# Patient Record
Sex: Female | Born: 1965 | Race: Black or African American | Hispanic: No | Marital: Single | Smoking: Never smoker
Health system: Southern US, Community
[De-identification: ages and names within clinical notes are randomized; demographics above are authoritative.]

## PROBLEM LIST (undated history)

## (undated) DIAGNOSIS — IMO0001 Reserved for inherently not codable concepts without codable children: Secondary | ICD-10-CM

## (undated) DIAGNOSIS — R030 Elevated blood-pressure reading, without diagnosis of hypertension: Secondary | ICD-10-CM

## (undated) DIAGNOSIS — R591 Generalized enlarged lymph nodes: Secondary | ICD-10-CM

## (undated) DIAGNOSIS — R87629 Unspecified abnormal cytological findings in specimens from vagina: Secondary | ICD-10-CM

## (undated) DIAGNOSIS — R61 Generalized hyperhidrosis: Secondary | ICD-10-CM

## (undated) DIAGNOSIS — N63 Unspecified lump in unspecified breast: Secondary | ICD-10-CM

## (undated) DIAGNOSIS — I10 Essential (primary) hypertension: Secondary | ICD-10-CM

## (undated) DIAGNOSIS — N805 Endometriosis of intestine, unspecified: Secondary | ICD-10-CM

## (undated) DIAGNOSIS — N951 Menopausal and female climacteric states: Secondary | ICD-10-CM

## (undated) DIAGNOSIS — Z973 Presence of spectacles and contact lenses: Secondary | ICD-10-CM

## (undated) DIAGNOSIS — D219 Benign neoplasm of connective and other soft tissue, unspecified: Secondary | ICD-10-CM

## (undated) HISTORY — DX: Reserved for inherently not codable concepts without codable children: IMO0001

## (undated) HISTORY — DX: Endometriosis of intestine: N80.5

## (undated) HISTORY — DX: Elevated blood-pressure reading, without diagnosis of hypertension: R03.0

## (undated) HISTORY — PX: OOPHORECTOMY: SHX86

## (undated) HISTORY — DX: Endometriosis of intestine, unspecified: N80.50

## (undated) HISTORY — PX: BREAST EXCISIONAL BIOPSY: SUR124

## (undated) HISTORY — PX: BREAST SURGERY: SHX581

## (undated) HISTORY — DX: Benign neoplasm of connective and other soft tissue, unspecified: D21.9

## (undated) HISTORY — DX: Menopausal and female climacteric states: N95.1

## (undated) HISTORY — DX: Unspecified abnormal cytological findings in specimens from vagina: R87.629

## (undated) HISTORY — DX: Generalized hyperhidrosis: R61

## (undated) HISTORY — DX: Unspecified lump in unspecified breast: N63.0

---

## 2006-09-28 HISTORY — PX: ABDOMINAL HYSTERECTOMY: SHX81

## 2007-06-20 ENCOUNTER — Ambulatory Visit: Payer: Self-pay | Admitting: Unknown Physician Specialty

## 2007-06-23 ENCOUNTER — Ambulatory Visit: Payer: Self-pay | Admitting: Unknown Physician Specialty

## 2014-08-14 LAB — HM MAMMOGRAPHY

## 2014-09-25 LAB — HM PAP SMEAR: HM Pap smear: NEGATIVE

## 2015-07-10 ENCOUNTER — Telehealth: Payer: Self-pay | Admitting: Obstetrics and Gynecology

## 2015-07-10 NOTE — Telephone Encounter (Signed)
PT HAS H/O BV AND THINKS SHE HAS IT NOW, WANTED TO KNOW IF DR DE COULD CALL IN RX FOR AT [place].

## 2015-07-11 MED ORDER — METRONIDAZOLE 0.75 % VA GEL: 1.0000 | Freq: Every day | VAGINAL | Status: DC

## 2015-07-11 NOTE — Telephone Encounter (Signed)
Pt aware.

## 2015-07-11 NOTE — Telephone Encounter (Signed)
Med erx.  

## 2015-07-11 NOTE — Addendum Note (Signed)
Addended by: Elouise Munroe on: 07/11/2015 11:47 AM   Modules accepted: Orders

## 2015-07-31 ENCOUNTER — Encounter: Payer: Self-pay | Admitting: Obstetrics and Gynecology

## 2015-07-31 ENCOUNTER — Ambulatory Visit (INDEPENDENT_AMBULATORY_CARE_PROVIDER_SITE_OTHER): Payer: 59 | Admitting: Obstetrics and Gynecology

## 2015-07-31 VITALS — BP 137/91 | HR 80 | Ht 67.0 in | Wt 163.2 lb

## 2015-07-31 DIAGNOSIS — Z90711 Acquired absence of uterus with remaining cervical stump: Secondary | ICD-10-CM

## 2015-07-31 DIAGNOSIS — IMO0001 Reserved for inherently not codable concepts without codable children: Secondary | ICD-10-CM

## 2015-07-31 DIAGNOSIS — Z01419 Encounter for gynecological examination (general) (routine) without abnormal findings: Secondary | ICD-10-CM

## 2015-07-31 DIAGNOSIS — Z90710 Acquired absence of both cervix and uterus: Secondary | ICD-10-CM | POA: Diagnosis not present

## 2015-07-31 DIAGNOSIS — Z1239 Encounter for other screening for malignant neoplasm of breast: Secondary | ICD-10-CM

## 2015-07-31 DIAGNOSIS — Z1211 Encounter for screening for malignant neoplasm of colon: Secondary | ICD-10-CM | POA: Diagnosis not present

## 2015-07-31 DIAGNOSIS — Z0000 Encounter for general adult medical examination without abnormal findings: Secondary | ICD-10-CM | POA: Diagnosis not present

## 2015-07-31 DIAGNOSIS — R030 Elevated blood-pressure reading, without diagnosis of hypertension: Secondary | ICD-10-CM | POA: Diagnosis not present

## 2015-07-31 NOTE — Progress Notes (Signed)
Patient ID: Tara [name], female   DOB: 12/29/1965, 49 y.o.   MRN: [id number] ANNUAL PREVENTATIVE CARE GYN  ENCOUNTER NOTE  Subjective:       Tara [name] is a 49 y.o. G77P1001 female here for a routine annual gynecologic exam.  Current complaints: 1.  Routine wellness exam   Gynecologic History No LMP recorded. Patient has had a hysterectomy. Contraception: status post hysterectomy, LSH-RSO Last Pap: 08/2014 neg/neg. Results were: normal. History of abnormal paps with positive HPV. Last mammogram: 07/2014. Results were: normal  Obstetric History OB History  Gravida Para Term Preterm AB SAB TAB Ectopic Multiple Living  1 1 1       1     # Outcome Date GA Lbr Len/2nd Weight Sex Delivery Anes PTL Lv  1 Term    6 lb 14.4 oz (3.13 kg) F Vag-Spont   Y      Past Medical History  Diagnosis Date  . Breast lump in female     rt side 10 oclock  . Night sweats   . Elevated BP   . Vaginal Pap smear, abnormal     ascus.pos  . Perimenopausal   . Fibroid     h/o    Past Surgical History  Procedure Laterality Date  . Breast surgery Right     lumpectomy   . Abdominal hysterectomy  2008    lsh/rso    No current outpatient prescriptions on file prior to visit.   No current facility-administered medications on file prior to visit.    No Known Allergies  Social History   Social History  . Marital Status: Single    Spouse Name: N/A  . Number of Children: N/A  . Years of Education: N/A   Occupational History  . Not on file.   Social History Main Topics  . Smoking status: Never Smoker   . Smokeless tobacco: Not on file  . Alcohol Use: Yes     Comment: occas  . Drug Use: No  . Sexual Activity: Yes    Birth Control/ Protection: Surgical   Other Topics Concern  . Not on file   Social History Narrative    Family History  Problem Relation Age of Onset  . Breast cancer Sister   . Diabetes Neg Hx   . Heart disease Neg Hx   . Colon cancer Neg Hx   . Ovarian  cancer Neg Hx     The following portions of the patient's history were reviewed and updated as appropriate: allergies, current medications, past family history, past medical history, past social history, past surgical history and problem list.  Review of Systems ROS Review of Systems - General ROS: negative for - chills, fatigue, fever, hot flashes, night sweats Psychological ROS: negative for - anxiety, decreased libido, depression, mood swings Ophthalmic ROS: negative for - blurry vision ENT ROS: negative for - headaches, hearing change, visual changes  Hematological and Lymphatic ROS: negative for - bleeding problems, bruising, swollen lymph nodes or weight loss Endocrine ROS: negative for - galactorrhea, hair pattern changes, hot flashes, malaise/lethargy, mood swings Breast ROS: negative for - new or changing breast lumps or nipple discharge Respiratory ROS: negative for - cough or shortness of breath Cardiovascular ROS: negative for - chest pain, irregular heartbeat, palpitations or shortness of breath Gastrointestinal ROS: no abdominal pain, change in bowel habits, or black or bloody stools Genito-Urinary ROS: no dysuria, trouble voiding, or hematuria Dermatological ROS: negative for rash and skin lesion changes  Objective:   BP 137/91 mmHg  Pulse 80  Ht 5\' 7"  (1.702 m)  Wt 163 lb 3.2 oz (74.027 kg)  BMI 25.55 kg/m2 CONSTITUTIONAL: Well-developed, well-nourished female in no acute distress.  PSYCHIATRIC: Normal mood and affect. Normal behavior.  [place]: Alert and oriented to person, place, and time. Normal muscle tone coordination.  HENT:  Normocephalic, atraumatic EYES: Conjunctivae and EOM are normal. .  NECK: Normal range of motion, supple, no masses.  Normal thyroid.  SKIN: Skin is warm and dry. No rash noted. Not diaphoretic. No erythema. No pallor. CARDIOVASCULAR: Normal heart rate noted, regular rhythm, no murmur. RESPIRATORY: Clear to auscultation bilaterally.   BREASTS: Symmetric in size. No masses, skin changes, nipple drainage, or lymphadenopathy. Scar from previous lumpectomy at 9 'oclock R breast, well healed ABDOMEN: Soft, normal bowel sounds, no distention noted.  No tenderness, rebound or guarding.  BLADDER: Normal PELVIC:  External Genitalia: Normal  BUS: Normal  Vagina: Normal  Cervix: Minimal white discharge  Uterus: Surgically absent  Adnexa: No  Masses palpable  RV: External Exam NormaI, No Rectal Masses and Normal Sphincter tone  MUSCULOSKELETAL: Normal range of motion.  LYMPHATIC: No Axillary, Supraclavicular, or Inguinal Adenopathy.    Assessment:   Annual gynecologic examination 49 y.o.  Contraception: status post hysterectomy, LSH-RSO secondary to fibroids Normal BMI  Elevated Blood pressure Family history of breast cancer   Plan:  Pap: Not needed until 2018 Mammogram: Ordered Stool Guaiac Testing:  Not Indicated Labs: thru employer Routine preventative health maintenance measures emphasized: Exercise/Diet/Weight control, Tobacco Warnings, Alcohol/Substance use risks and Stress Management Continue vitamin D/calcium supplements, weight bearing exercises Will need to monitor blood pressure   Return to [place], CMA [place], PA-S Brayton Mars, MD   I have seen, interviewed, and examined the patient in conjunction with the [place].A. student and affirm the diagnosis and management plan. Martin A. DeFrancesco, MD, Cherlynn June

## 2015-07-31 NOTE — Patient Instructions (Signed)
1.  Pap smear not done today.  Next Pap is due in 2018. 2.  Mammogram recommended. 3.  Stool guaiac cards. 4.  Screening labs 5.  Continue with calcium and vitamin D supplementation, weight bearing exercise. 6.  Return in 1 year.

## 2015-08-12 ENCOUNTER — Telehealth: Payer: Self-pay

## 2015-08-12 ENCOUNTER — Other Ambulatory Visit: Payer: Self-pay

## 2015-08-12 NOTE — Telephone Encounter (Signed)
Gastroenterology Pre-Procedure Review  Request Date: 08/30/15 Requesting Physician:   PATIENT REVIEW QUESTIONS: The patient responded to the following health history questions as indicated:    1. Are you having any GI issues? no 2. Do you have a personal history of Polyps? no 3. Do you have a family history of Colon Cancer or Polyps? no 4. Diabetes Mellitus? no 5. Joint replacements in the past 12 months?no 6. Major health problems in the past 3 months?no 7. Any artificial heart valves, MVP, or defibrillator?no    MEDICATIONS & ALLERGIES:    Patient reports the following regarding taking any anticoagulation/antiplatelet therapy:   Plavix, Coumadin, Eliquis, Xarelto, Lovenox, Pradaxa, Brilinta, or Effient? no Aspirin? no  Patient confirms/reports the following medications:  Current Outpatient Prescriptions  Medication Sig Dispense Refill  . cholecalciferol (VITAMIN D) 1000 UNITS tablet Take 1,000 Units by mouth daily.    . Multiple Vitamin (MULTIVITAMIN) tablet Take 1 tablet by mouth daily.     No current facility-administered medications for this visit.    Patient confirms/reports the following allergies:  No Known Allergies  No orders of the defined types were placed in this encounter.    AUTHORIZATION INFORMATION Primary Insurance: 1D#: Group #:  Secondary Insurance: 1D#: Group #:  SCHEDULE INFORMATION: Date: 08/30/15 Time: Location: [place]

## 2015-08-20 ENCOUNTER — Encounter: Payer: Self-pay | Admitting: *Deleted

## 2015-08-29 NOTE — Discharge Instructions (Signed)

## 2015-08-30 ENCOUNTER — Ambulatory Visit: Payer: 59 | Admitting: Anesthesiology

## 2015-08-30 ENCOUNTER — Encounter
Admission: RE | Disposition: A | Discharge status: Home / Self Care | Payer: Self-pay | Source: Ambulatory Visit | Attending: Gastroenterology

## 2015-08-30 ENCOUNTER — Ambulatory Visit
Admission: RE | Admit: 2015-08-30 | Discharge: 2015-08-30 | Disposition: A | Discharge status: Home / Self Care | Payer: 59 | Source: Ambulatory Visit | Attending: Gastroenterology | Admitting: Gastroenterology

## 2015-08-30 DIAGNOSIS — Z1211 Encounter for screening for malignant neoplasm of colon: Secondary | ICD-10-CM | POA: Insufficient documentation

## 2015-08-30 DIAGNOSIS — D122 Benign neoplasm of ascending colon: Secondary | ICD-10-CM

## 2015-08-30 DIAGNOSIS — D125 Benign neoplasm of sigmoid colon: Secondary | ICD-10-CM

## 2015-08-30 DIAGNOSIS — Z780 Asymptomatic menopausal state: Secondary | ICD-10-CM | POA: Insufficient documentation

## 2015-08-30 DIAGNOSIS — D123 Benign neoplasm of transverse colon: Secondary | ICD-10-CM | POA: Diagnosis not present

## 2015-08-30 DIAGNOSIS — Z90710 Acquired absence of both cervix and uterus: Secondary | ICD-10-CM | POA: Diagnosis not present

## 2015-08-30 DIAGNOSIS — K640 First degree hemorrhoids: Secondary | ICD-10-CM | POA: Insufficient documentation

## 2015-08-30 HISTORY — DX: Presence of spectacles and contact lenses: Z97.3

## 2015-08-30 HISTORY — PX: POLYPECTOMY: SHX5525

## 2015-08-30 HISTORY — PX: COLONOSCOPY WITH PROPOFOL: SHX5780

## 2015-08-30 SURGERY — COLONOSCOPY WITH PROPOFOL
Anesthesia: Monitor Anesthesia Care

## 2015-08-30 MED ORDER — LIDOCAINE HCL (CARDIAC) 20 MG/ML IV SOLN
INTRAVENOUS | Status: DC | PRN
  Administered 2015-08-30: 40 mg via INTRAVENOUS

## 2015-08-30 MED ORDER — PROPOFOL 10 MG/ML IV BOLUS
INTRAVENOUS | Status: DC | PRN
  Administered 2015-08-30: 50 mg via INTRAVENOUS
  Administered 2015-08-30: 30 mg via INTRAVENOUS
  Administered 2015-08-30: 20 mg via INTRAVENOUS
  Administered 2015-08-30: 50 mg via INTRAVENOUS
  Administered 2015-08-30: 100 mg via INTRAVENOUS
  Administered 2015-08-30: 30 mg via INTRAVENOUS
  Administered 2015-08-30 (×2): 50 mg via INTRAVENOUS
  Administered 2015-08-30: 20 mg via INTRAVENOUS

## 2015-08-30 MED ORDER — SODIUM CHLORIDE 0.9 % IV SOLN: INTRAVENOUS | Status: DC

## 2015-08-30 MED ORDER — SIMETHICONE 40 MG/0.6ML PO SUSP
ORAL | Status: DC | PRN
  Administered 2015-08-30: 11:00:00

## 2015-08-30 MED ORDER — LACTATED RINGERS IV SOLN
INTRAVENOUS | Status: DC | PRN
  Administered 2015-08-30: 10:00:00 via INTRAVENOUS

## 2015-08-30 SURGICAL SUPPLY — 28 items
CANISTER SUCT 1200ML W/VALVE (MISCELLANEOUS) ×4 IMPLANT
FCP ESCP3.2XJMB 240X2.8X (MISCELLANEOUS)
FORCEPS BIOP RAD 4 LRG CAP 4 (CUTTING FORCEPS) ×4 IMPLANT
FORCEPS BIOP RJ4 240 W/NDL (MISCELLANEOUS)
FORCEPS ESCP3.2XJMB 240X2.8X (MISCELLANEOUS) IMPLANT
GOWN CVR UNV OPN BCK APRN NK (MISCELLANEOUS) ×4 IMPLANT
GOWN ISOL THUMB LOOP REG UNIV (MISCELLANEOUS) ×4
HEMOCLIP INSTINCT (CLIP) IMPLANT
INJECTOR VARIJECT VIN23 (MISCELLANEOUS) IMPLANT
KIT CO2 TUBING (TUBING) IMPLANT
KIT DEFENDO VALVE AND CONN (KITS) IMPLANT
KIT ENDO PROCEDURE OLY (KITS) ×4 IMPLANT
LIGATOR MULTIBAND 6SHOOTER MBL (MISCELLANEOUS) IMPLANT
MARKER SPOT ENDO TATTOO 5ML (MISCELLANEOUS) IMPLANT
PAD GROUND ADULT SPLIT (MISCELLANEOUS) IMPLANT
SNARE SHORT THROW 13M SML OVAL (MISCELLANEOUS) IMPLANT
SNARE SHORT THROW 30M LRG OVAL (MISCELLANEOUS) IMPLANT
SPOT EX ENDOSCOPIC TATTOO (MISCELLANEOUS)
SUCTION POLY TRAP 4CHAMBER (MISCELLANEOUS) IMPLANT
TRAP SUCTION POLY (MISCELLANEOUS) IMPLANT
TUBING CONN 6MMX3.1M (TUBING)
TUBING SUCTION CONN 0.25 STRL (TUBING) IMPLANT
UNDERPAD 30X60 958B10 (PK) (MISCELLANEOUS) IMPLANT
VALVE BIOPSY ENDO (VALVE) IMPLANT
VARIJECT INJECTOR VIN23 (MISCELLANEOUS)
WATER AUXILLARY (MISCELLANEOUS) IMPLANT
WATER STERILE IRR 250ML POUR (IV SOLUTION) ×4 IMPLANT
WATER STERILE IRR 500ML POUR (IV SOLUTION) IMPLANT

## 2015-08-30 NOTE — Anesthesia Preprocedure Evaluation (Addendum)
Anesthesia Evaluation  Patient identified by MRN, date of birth, ID band Patient awake    Reviewed: Allergy & Precautions, H&P , NPO status , Patient's Chart, lab work & pertinent test results, reviewed documented beta blocker date and time   Airway Mallampati: I  TM Distance: >3 FB Neck ROM: full    Dental no notable dental hx.    Pulmonary neg pulmonary ROS,    Pulmonary exam normal breath sounds clear to auscultation       Cardiovascular Exercise Tolerance: Good negative cardio ROS   Rhythm:regular Rate:Normal     Neuro/Psych negative neurological ROS  negative psych ROS   GI/Hepatic negative GI ROS, Neg liver ROS,   Endo/Other  negative endocrine ROS  Renal/GU negative Renal ROS  negative genitourinary   Musculoskeletal   Abdominal   Peds  Hematology negative hematology ROS (+)   Anesthesia Other Findings   Reproductive/Obstetrics negative OB ROS                             Anesthesia Physical Anesthesia Plan  ASA: I  Anesthesia Plan: MAC   Post-op Pain Management:    Induction: Intravenous  Airway Management Planned: Nasal Cannula  Additional Equipment:   Intra-op Plan:   Post-operative Plan:   Informed Consent: I have reviewed the patients History and Physical, chart, labs and discussed the procedure including the risks, benefits and alternatives for the proposed anesthesia with the patient or authorized representative who has indicated his/her understanding and acceptance.   Dental Advisory Given  Plan Discussed with: CRNA  Anesthesia Plan Comments:        Anesthesia Quick Evaluation

## 2015-08-30 NOTE — Op Note (Signed)
[facility] Gastroenterology Patient Name: Tara [name] Procedure Date: 08/30/2015 10:28 AM MRN: [id number] Account #: [id number] Date of Birth: [birth year] Admit Type: Outpatient Age: 49 Room: [facility] OR ROOM 01 Gender: Female Note Status: Finalized Procedure:         Colonoscopy Indications:       Screening for colorectal malignant neoplasm Providers:         Lucilla Lame, MD Referring MD:      Alanda Slim. Defrancesco, MD (Referring MD) Medicines:         Propofol per Anesthesia Complications:     No immediate complications. Procedure:         Pre-Anesthesia Assessment:                    - Prior to the procedure, a History and Physical was                     performed, and patient medications and allergies were                     reviewed. The patient's tolerance of previous anesthesia                     was also reviewed. The risks and benefits of the procedure                     and the sedation options and risks were discussed with the                     patient. All questions were answered, and informed consent                     was obtained. Prior Anticoagulants: The patient has taken                     no previous anticoagulant or antiplatelet agents. ASA                     Grade Assessment: II - A patient with mild systemic                     disease. After reviewing the risks and benefits, the                     patient was deemed in satisfactory condition to undergo                     the procedure.                    After obtaining informed consent, the colonoscope was                     passed under direct vision. Throughout the procedure, the                     patient's blood pressure, pulse, and oxygen saturations                     were monitored continuously. The was introduced through                     the anus and advanced to the the cecum, identified by  appendiceal orifice and ileocecal valve. The colonoscopy                      was performed without difficulty. The patient tolerated                     the procedure fairly well. The quality of the bowel                     preparation was excellent. Findings:      The perianal and digital rectal examinations were normal.      A 4 mm polyp was found in the ascending colon. The polyp was sessile.       The polyp was removed with a cold biopsy forceps. Resection and       retrieval were complete.      A 5 mm polyp was found in the sigmoid colon. The polyp was sessile. The       polyp was removed with a cold biopsy forceps. Resection and retrieval       were complete.      Non-bleeding internal hemorrhoids were found during retroflexion. The       hemorrhoids were Grade I (internal hemorrhoids that do not prolapse).      A 4 mm polyp was found in the transverse colon. The polyp was sessile.       The polyp was removed with a cold biopsy forceps. Resection and       retrieval were complete. Impression:        - One 4 mm polyp in the ascending colon. Resected and                     retrieved.                    - One 5 mm polyp in the sigmoid colon. Resected and                     retrieved.                    - Non-bleeding internal hemorrhoids.                    - One 4 mm polyp in the transverse colon. Resected and                     retrieved. Recommendation:    - Repeat colonoscopy in 3 years if polyp adenoma and 10                     years if hyperplastic Procedure Code(s): --- Professional ---                    [phone], Colonoscopy, flexible; with biopsy, single or                     multiple Diagnosis Code(s): --- Professional ---                    Z12.11, Encounter for screening for malignant neoplasm of                     colon                    D12.2, Benign neoplasm of ascending colon  D12.5, Benign neoplasm of sigmoid colon                    D12.3, Benign neoplasm of transverse colon CPT copyright 2014  American Medical Association. All rights reserved. The codes documented in this report are preliminary and upon coder review may  be revised to meet current compliance requirements. Lucilla Lame, MD 08/30/2015 10:48:55 AM This report has been signed electronically. Number of Addenda: 0 Note Initiated On: 08/30/2015 10:28 AM Scope Withdrawal Time: 0 hours 8 minutes 24 seconds  Total Procedure Duration: 0 hours 12 minutes 17 seconds       [facility]

## 2015-08-30 NOTE — Anesthesia Postprocedure Evaluation (Addendum)
Anesthesia Post Note  Patient: Tara [name]  Procedure(s) Performed: Procedure(s) (LRB): COLONOSCOPY WITH PROPOFOL (N/A) POLYPECTOMY  Patient location during evaluation: PACU Anesthesia Type: MAC Level of consciousness: awake and alert Pain management: pain level controlled Vital Signs Assessment: post-procedure vital signs reviewed and stable Respiratory status: spontaneous breathing, nonlabored ventilation and respiratory function stable Cardiovascular status: blood pressure returned to baseline and stable Postop Assessment: no signs of nausea or vomiting Anesthetic complications: no    Trecia Rogers

## 2015-08-30 NOTE — H&P (Signed)
  [facility]  [address], [place], [place] [address] Phone: [phone] Fax : [phone]  Primary Care Physician:  Brayton Mars, MD Primary Gastroenterologist:  Dr. Allen Norris  Pre-Procedure History & Physical: HPI:  Tara [name] is a 49 y.o. female is here for a screening colonoscopy.   Past Medical History  Diagnosis Date  . Breast lump in female     rt side 10 oclock  . Night sweats   . Elevated BP   . Vaginal Pap smear, abnormal     ascus.pos  . Perimenopausal   . Fibroid     h/o  . Wears contact lenses     Past Surgical History  Procedure Laterality Date  . Breast surgery Right     lumpectomy   . Abdominal hysterectomy  2008    lsh/rso    Prior to Admission medications   Medication Sig Start Date End Date Taking? Authorizing Provider  cholecalciferol (VITAMIN D) 1000 UNITS tablet Take 1,000 Units by mouth daily.   Yes Historical Provider, MD  Multiple Vitamin (MULTIVITAMIN) tablet Take 1 tablet by mouth daily.   Yes Historical Provider, MD    Allergies as of 08/12/2015  . (No Known Allergies)    Family History  Problem Relation Age of Onset  . Breast cancer Sister   . Diabetes Neg Hx   . Heart disease Neg Hx   . Colon cancer Neg Hx   . Ovarian cancer Neg Hx     Social History   Social History  . Marital Status: Single    Spouse Name: N/A  . Number of Children: N/A  . Years of Education: N/A   Occupational History  . Not on file.   Social History Main Topics  . Smoking status: Never Smoker   . Smokeless tobacco: Not on file  . Alcohol Use: Yes     Comment: rare  . Drug Use: No  . Sexual Activity: Yes    Birth Control/ Protection: Surgical   Other Topics Concern  . Not on file   Social History Narrative    Review of Systems: See HPI, otherwise negative ROS  Physical Exam: BP 142/108 mmHg  Pulse 79  Temp(Src) 99.7 F (37.6 C) (Temporal)  Resp 16  Ht 5\' 7"  (1.702 m)  Wt 158 lb (71.668 kg)  BMI  24.74 kg/m2  SpO2 100% General:   Alert,  pleasant and cooperative in NAD Head:  Normocephalic and atraumatic. Neck:  Supple; no masses or thyromegaly. Lungs:  Clear throughout to auscultation.    Heart:  Regular rate and rhythm. Abdomen:  Soft, nontender and nondistended. Normal bowel sounds, without guarding, and without rebound.   Neurologic:  Alert and  oriented x4;  grossly normal neurologically.  Impression/Plan: [name] is now here to undergo a screening colonoscopy.  Risks, benefits, and alternatives regarding colonoscopy have been reviewed with the patient.  Questions have been answered.  All parties agreeable.

## 2015-08-30 NOTE — Addendum Note (Signed)
Addendum  created 08/30/15 1149 by Rochel Brome, MD   Modules edited: Clinical Notes   Clinical Notes:  File: [id number]

## 2015-08-30 NOTE — Transfer of Care (Signed)
Immediate Anesthesia Transfer of Care Note  Patient: SRISHTI LABRAKE  Procedure(s) Performed: Procedure(s): COLONOSCOPY WITH PROPOFOL (N/A) POLYPECTOMY  Patient Location: PACU  Anesthesia Type: General, MAC  Level of Consciousness: awake, alert  and patient cooperative  Airway and Oxygen Therapy: Patient Spontanous Breathing and Patient connected to supplemental oxygen  Post-op Assessment: Post-op Vital signs reviewed, Patient's Cardiovascular Status Stable, Respiratory Function Stable, Patent Airway and No signs of Nausea or vomiting  Post-op Vital Signs: Reviewed and stable  Complications: No apparent anesthesia complications

## 2015-09-02 ENCOUNTER — Encounter: Payer: Self-pay | Admitting: Gastroenterology

## 2015-09-05 LAB — SURGICAL PATHOLOGY

## 2015-09-08 ENCOUNTER — Encounter: Payer: Self-pay | Admitting: Gastroenterology

## 2015-09-10 ENCOUNTER — Encounter: Payer: Self-pay | Admitting: Obstetrics and Gynecology

## 2015-11-11 NOTE — Addendum Note (Signed)
Addendum  created 11/11/15 0910 by Rochel Brome, MD   Modules edited: Clinical Notes   Clinical Notes:  File: [id number]

## 2016-08-03 NOTE — Progress Notes (Signed)
Patient ID: Tara [name], female   DOB: December 24, 1965, 50 y.o.   MRN: [id number] ANNUAL PREVENTATIVE CARE GYN  ENCOUNTER NOTE  Subjective:       Tara [name] is a 50 y.o. G79P1001 female here for a routine annual gynecologic exam.  Current complaints: 1.  Routine wellness exam  Patient had colonoscopy in December 2016. Next Pap smear is due 2018. Patient is exercising and taking calcium with vitamin D supplementation. Patient has had screening lab work done in primary care-normal; she is sending results to [place] for review.  Gynecologic History No LMP recorded. Patient has had a hysterectomy. Contraception: status post hysterectomy, LSH-RSO Last Pap: 08/2014 neg/neg. Results were: normal. History of abnormal paps with positive HPV. Last mammogram: 07/2015 wnl. Results were: normal  Obstetric History OB History  Gravida Para Term Preterm AB Living  1 1 1     1   SAB TAB Ectopic Multiple Live Births          1    # Outcome Date GA Lbr Len/2nd Weight Sex Delivery Anes PTL Lv  1 Term    6 lb 14.4 oz (3.13 kg) F Vag-Spont   LIV      Past Medical History:  Diagnosis Date  . Breast lump in female    rt side 10 oclock  . Elevated BP   . Endometriosis of intestine    Sigmoid colon polyp  . Fibroid    h/o  . Night sweats   . Perimenopausal   . Vaginal Pap smear, abnormal    ascus.pos  . Wears contact lenses     Past Surgical History:  Procedure Laterality Date  . ABDOMINAL HYSTERECTOMY  2008   lsh/rso  . BREAST SURGERY Right    lumpectomy   . COLONOSCOPY WITH PROPOFOL N/A 08/30/2015   Procedure: COLONOSCOPY WITH PROPOFOL;  Surgeon: Lucilla Lame, MD;  Location: [facility];  Service: Endoscopy;  Laterality: N/A;  . POLYPECTOMY  08/30/2015   Procedure: POLYPECTOMY;  Surgeon: Lucilla Lame, MD;  Location: [facility];  Service: Endoscopy;;    Current Outpatient Prescriptions on File Prior to Visit  Medication Sig Dispense Refill  . cholecalciferol (VITAMIN D)  1000 UNITS tablet Take 1,000 Units by mouth daily.    . Multiple Vitamin (MULTIVITAMIN) tablet Take 1 tablet by mouth daily.     No current facility-administered medications on file prior to visit.     No Known Allergies  Social History   Social History  . Marital status: Single    Spouse name: N/A  . Number of children: N/A  . Years of education: N/A   Occupational History  . Not on file.   Social History Main Topics  . Smoking status: Never Smoker  . Smokeless tobacco: Not on file  . Alcohol use Yes     Comment: rare  . Drug use: No  . Sexual activity: Yes    Birth control/ protection: Surgical   Other Topics Concern  . Not on file   Social History Narrative  . No narrative on file    Family History  Problem Relation Age of Onset  . Breast cancer Sister   . Diabetes Neg Hx   . Heart disease Neg Hx   . Colon cancer Neg Hx   . Ovarian cancer Neg Hx     The following portions of the patient's history were reviewed and updated as appropriate: allergies, current medications, past family history, past medical history, past social history, past  surgical history and problem list.  Review of Systems ROS Review of Systems - General ROS: negative for - chills, fatigue, fever, hot flashes, night sweats Psychological ROS: negative for - anxiety, decreased libido, depression, mood swings Ophthalmic ROS: negative for - blurry vision ENT ROS: negative for - headaches, hearing change, visual changes  Hematological and Lymphatic ROS: negative for - bleeding problems, bruising, swollen lymph nodes or weight loss Endocrine ROS: negative for - galactorrhea, hair pattern changes, hot flashes, malaise/lethargy, mood swings Breast ROS: negative for - new or changing breast lumps or nipple discharge Respiratory ROS: negative for - cough or shortness of breath Cardiovascular ROS: negative for - chest pain, irregular heartbeat, palpitations or shortness of breath Gastrointestinal ROS:  no abdominal pain, change in bowel habits, or black or bloody stools Genito-Urinary ROS: no dysuria, trouble voiding, or hematuria Dermatological ROS: negative for rash and skin lesion changes   Objective:   BP 136/84   Pulse 71   Ht 5\' 7"  (1.702 m)   Wt 173 lb 1.6 oz (78.5 kg)   BMI 27.11 kg/m  CONSTITUTIONAL: Well-developed, well-nourished female in no acute distress.  PSYCHIATRIC: Normal mood and affect. Normal behavior.  [place]: Alert and oriented to person, place, and time. Normal muscle tone coordination.  HENT:  Normocephalic, atraumatic EYES: Conjunctivae and EOM are normal. .  NECK: Normal range of motion, supple, no masses.  Normal thyroid.  SKIN: Skin is warm and dry. No rash noted. Not diaphoretic. No erythema. No pallor. CARDIOVASCULAR: Normal heart rate noted, regular rhythm, no murmur. RESPIRATORY: Clear to auscultation bilaterally.  BREASTS: Symmetric in size. No masses, skin changes, nipple drainage, or lymphadenopathy. Scar from previous lumpectomy at 9 'oclock R breast, well healed ABDOMEN: Soft, normal bowel sounds, no distention noted.  No tenderness, rebound or guarding.  BLADDER: Normal PELVIC:  External Genitalia: Normal  BUS: Normal  Vagina: Normal  Cervix: Minimal white discharge  Uterus: Surgically absent  Adnexa: No  Masses palpable  RV: Moderate stool in the rectal vault, External Exam NormaI, No Rectal Masses and Normal Sphincter tone  MUSCULOSKELETAL: Normal range of motion.  LYMPHATIC: No Axillary, Supraclavicular, or Inguinal Adenopathy.    Assessment:   Annual gynecologic examination 50 y.o.  Contraception: status post hysterectomy, LSH-RSO secondary to fibroids Normal BMI  Elevated Blood pressure Family history of breast cancer History of endometriosis, asymptomatic Menopause, minimally symptomatic   Plan:  Pap: Not needed until 2018 Mammogram: Ordered Stool Guaiac Testing:  utd- 08/2015  Labs: thru employer Routine  preventative health maintenance measures emphasized: Exercise/Diet/Weight control, Tobacco Warnings, Alcohol/Substance use risks and Stress Management Continue vitamin D/calcium supplements, weight bearing exercises Will need to monitor blood pressure   Return to [place], CMA Brayton Mars, MD  Note: This dictation was prepared with Dragon dictation along with smaller phrase technology. Any transcriptional errors that result from this process are unintentional.     I

## 2016-08-04 ENCOUNTER — Ambulatory Visit (INDEPENDENT_AMBULATORY_CARE_PROVIDER_SITE_OTHER): Payer: 59 | Admitting: Obstetrics and Gynecology

## 2016-08-04 ENCOUNTER — Encounter: Payer: Self-pay | Admitting: Obstetrics and Gynecology

## 2016-08-04 VITALS — BP 136/84 | HR 71 | Ht 67.0 in | Wt 173.1 lb

## 2016-08-04 DIAGNOSIS — Z1239 Encounter for other screening for malignant neoplasm of breast: Secondary | ICD-10-CM

## 2016-08-04 DIAGNOSIS — Z1231 Encounter for screening mammogram for malignant neoplasm of breast: Secondary | ICD-10-CM | POA: Diagnosis not present

## 2016-08-04 DIAGNOSIS — Z90711 Acquired absence of uterus with remaining cervical stump: Secondary | ICD-10-CM

## 2016-08-04 DIAGNOSIS — Z01419 Encounter for gynecological examination (general) (routine) without abnormal findings: Secondary | ICD-10-CM

## 2016-08-04 NOTE — Patient Instructions (Signed)

## 2016-12-21 ENCOUNTER — Telehealth: Payer: Self-pay | Admitting: *Deleted

## 2016-12-21 NOTE — Telephone Encounter (Signed)
Patient called and states she is having symptoms of BV. She is wondering if Dr. Tennis Must can call her in something for it. I made the patient aware that he may want to  see her . She is aware that an appt may be need. Her pharmacy is [facility]. Let me know is she needs an appt . Please advise . Thank you

## 2016-12-22 ENCOUNTER — Ambulatory Visit (INDEPENDENT_AMBULATORY_CARE_PROVIDER_SITE_OTHER): Payer: 59 | Admitting: Obstetrics and Gynecology

## 2016-12-22 ENCOUNTER — Encounter: Payer: Self-pay | Admitting: Obstetrics and Gynecology

## 2016-12-22 VITALS — BP 160/88 | HR 84 | Ht 67.0 in | Wt 169.4 lb

## 2016-12-22 DIAGNOSIS — B9689 Other specified bacterial agents as the cause of diseases classified elsewhere: Secondary | ICD-10-CM | POA: Diagnosis not present

## 2016-12-22 DIAGNOSIS — N760 Acute vaginitis: Secondary | ICD-10-CM | POA: Diagnosis not present

## 2016-12-22 MED ORDER — METRONIDAZOLE 0.75 % VA GEL: 1.0000 | Freq: Every day | VAGINAL | 0 refills | Status: DC

## 2016-12-22 NOTE — Addendum Note (Signed)
Addended by: Elouise Munroe on: 12/22/2016 03:57 PM   Modules accepted: Orders

## 2016-12-22 NOTE — Progress Notes (Signed)
Chief complaint: 1. Vaginal discharge  Patient presents for evaluation of new onset vaginal discharge. No recent antibiotic usage. No recent change in sex partner. Patient is status post Victor in the past. No recent treatment for vaginal discharge.  Past medical history, past surgical history, problem list, medications, and allergies are reviewed  OBJECTIVE: BP (!) 160/88   Pulse 84   Ht 5\' 7"  (1.702 m)   Wt 169 lb 6.4 oz (76.8 kg)   BMI 26.53 kg/m  Pleasant female in no acute distress Pelvic exam: External genitalia-normal BUS-mobile Vagina-thin white secretions present; no odor Cervix-no lesions; no cervical motion tenderness Rectovaginal-no external abnormality  PROCEDURE: Wet prep KOH-positive with test Normal saline-positive clue cells; few white blood cells; no Trichomonas  ASSESSMENT: 1. Bacterial vaginosis  PLAN: 1. MetroGel 1 applicator intravaginal daily 5 days 2. Follow-up as needed 3. Literature on endometrial vaginosis is given and discussed.  A total of 15 minutes were spent face-to-face with the patient during this encounter and over half of that time dealt with counseling and coordination of care.  Brayton Mars, MD  Note: This dictation was prepared with Dragon dictation along with smaller phrase technology. Any transcriptional errors that result from this process are unintentional.

## 2016-12-22 NOTE — Patient Instructions (Signed)
1.  MetroGel 1 applicator daily for 5 days 2. Follow-up as needed Bacterial Vaginosis Bacterial vaginosis is a vaginal infection that occurs when the normal balance of bacteria in the vagina is disrupted. It results from an overgrowth of certain bacteria. This is the most common vaginal infection among women ages 81-44. Because bacterial vaginosis increases your risk for STIs (sexually transmitted infections), getting treated can help reduce your risk for chlamydia, gonorrhea, herpes, and HIV (human immunodeficiency virus). Treatment is also important for preventing complications in pregnant women, because this condition can cause an early (premature) delivery. What are the causes? This condition is caused by an increase in harmful bacteria that are normally present in small amounts in the vagina. However, the reason that the condition develops is not fully understood. What increases the risk? The following factors may make you more likely to develop this condition:  Having a new sexual partner or multiple sexual partners.  Having unprotected sex.  Douching.  Having an intrauterine device (IUD).  Smoking.  Drug and alcohol abuse.  Taking certain antibiotic medicines.  Being pregnant. You cannot get bacterial vaginosis from toilet seats, bedding, swimming pools, or contact with objects around you. What are the signs or symptoms? Symptoms of this condition include:  Grey or white vaginal discharge. The discharge can also be watery or foamy.  A fish-like odor with discharge, especially after sexual intercourse or during menstruation.  Itching in and around the vagina.  Burning or pain with urination. Some women with bacterial vaginosis have no signs or symptoms. How is this diagnosed? This condition is diagnosed based on:  Your medical history.  A physical exam of the vagina.  Testing a sample of vaginal fluid under a microscope to look for a large amount of bad bacteria or  abnormal cells. Your health care provider may use a cotton swab or a small wooden spatula to collect the sample. How is this treated? This condition is treated with antibiotics. These may be given as a pill, a vaginal cream, or a medicine that is put into the vagina (suppository). If the condition comes back after treatment, a second round of antibiotics may be needed. Follow these instructions at home: Medicines   Take over-the-counter and prescription medicines only as told by your health care provider.  Take or use your antibiotic as told by your health care provider. Do not stop taking or using the antibiotic even if you start to feel better. General instructions   If you have a female sexual partner, tell her that you have a vaginal infection. She should see her health care provider and be treated if she has symptoms. If you have a female sexual partner, he does not need treatment.  During treatment:  Avoid sexual activity until you finish treatment.  Do not douche.  Avoid alcohol as directed by your health care provider.  Avoid breastfeeding as directed by your health care provider.  Drink enough water and fluids to keep your urine clear or pale yellow.  Keep the area around your vagina and rectum clean.  Wash the area daily with warm water.  Wipe yourself from front to back after using the toilet.  Keep all follow-up visits as told by your health care provider. This is important. How is this prevented?  Do not douche.  Wash the outside of your vagina with warm water only.  Use protection when having sex. This includes latex condoms and dental dams.  Limit how many sexual partners you have. To  help prevent bacterial vaginosis, it is best to have sex with just one partner (monogamous).  Make sure you and your sexual partner are tested for STIs.  Wear cotton or cotton-lined underwear.  Avoid wearing tight pants and pantyhose, especially during summer.  Limit the  amount of alcohol that you drink.  Do not use any products that contain nicotine or tobacco, such as cigarettes and e-cigarettes. If you need help quitting, ask your health care provider.  Do not use illegal drugs. Where to find more information:  Centers for Disease Control and Prevention: AppraiserFraud.fi  American Sexual Health Association (ASHA): www.ashastd.org  U.[place]. Department of Health and Financial controller, Office on Women's Health: DustingSprays.pl or SecuritiesCard.it Contact a health care provider if:  Your symptoms do not improve, even after treatment.  You have more discharge or pain when urinating.  You have a fever.  You have pain in your abdomen.  You have pain during sex.  You have vaginal bleeding between periods. Summary  Bacterial vaginosis is a vaginal infection that occurs when the normal balance of bacteria in the vagina is disrupted.  Because bacterial vaginosis increases your risk for STIs (sexually transmitted infections), getting treated can help reduce your risk for chlamydia, gonorrhea, herpes, and HIV (human immunodeficiency virus). Treatment is also important for preventing complications in pregnant women, because the condition can cause an early (premature) delivery.  This condition is treated with antibiotic medicines. These may be given as a pill, a vaginal cream, or a medicine that is put into the vagina (suppository). This information is not intended to replace advice given to you by your health care provider. Make sure you discuss any questions you have with your health care provider. Document Released: 09/14/2005 Document Revised: 05/30/2016 Document Reviewed: 05/30/2016 Elsevier Interactive Patient Education  2017 [employer].

## 2016-12-24 LAB — NUSWAB BV AND CANDIDA, NAA
ATOPOBIUM VAGINAE: HIGH {score} — AB
BVAB 2: HIGH Score — AB
CANDIDA GLABRATA, NAA: NEGATIVE
Candida albicans, NAA: NEGATIVE
Megasphaera 1: HIGH Score — AB

## 2017-02-24 ENCOUNTER — Other Ambulatory Visit: Payer: Self-pay | Admitting: Obstetrics and Gynecology

## 2017-02-24 ENCOUNTER — Telehealth: Payer: Self-pay | Admitting: Obstetrics and Gynecology

## 2017-02-24 MED ORDER — FLUCONAZOLE 150 MG PO TABS: 150.0000 mg | ORAL_TABLET | Freq: Every day | ORAL | 0 refills | Status: DC

## 2017-02-24 NOTE — Telephone Encounter (Signed)
Patient has yeast infection and wants to know if you'll call in a diflucan to [facility]  Please call her

## 2017-02-24 NOTE — Telephone Encounter (Signed)
Please call her back

## 2017-02-24 NOTE — Telephone Encounter (Signed)
Itching only x 3 days. D/c- done. Recently used a new shower gel.  Advised dove sensitive in vaginal area. Pt aware if sx are no better in 7 days she will need to make an appointment.

## 2017-03-09 NOTE — Telephone Encounter (Signed)
ERROR

## 2017-03-30 ENCOUNTER — Encounter: Payer: Self-pay | Admitting: Obstetrics and Gynecology

## 2017-03-30 ENCOUNTER — Ambulatory Visit (INDEPENDENT_AMBULATORY_CARE_PROVIDER_SITE_OTHER): Payer: 59 | Admitting: Obstetrics and Gynecology

## 2017-03-30 VITALS — BP 182/113 | HR 70 | Ht 67.0 in | Wt 173.2 lb

## 2017-03-30 DIAGNOSIS — B373 Candidiasis of vulva and vagina: Secondary | ICD-10-CM | POA: Diagnosis not present

## 2017-03-30 DIAGNOSIS — B3731 Acute candidiasis of vulva and vagina: Secondary | ICD-10-CM

## 2017-03-30 MED ORDER — FLUCONAZOLE 150 MG PO TABS: 150.0000 mg | ORAL_TABLET | ORAL | 0 refills | Status: DC

## 2017-03-30 NOTE — Progress Notes (Signed)
HPI:      [name] is a 51 y.o. G1P1001 who LMP was No LMP recorded. Patient has had a hysterectomy.  Subjective:   She presents today With complaint of some vaginal discharge and vaginal itching. She was treated for BV several weeks ago followed by a subsequent episode of monilia. Over the last month she feels like her symptoms have returned. She denies new sexual partners. She denies odor. Significant history includes a history of supracervical hysterectomy.    Hx: The following portions of the patient's history were reviewed and updated as appropriate:             She  has a past medical history of Breast lump in female; Elevated BP; Endometriosis of intestine; Fibroid; Night sweats; Perimenopausal; Vaginal Pap smear, abnormal; and Wears contact lenses. She  does not have any pertinent problems on file. She  has a past surgical history that includes Breast surgery (Right); Abdominal hysterectomy (2008); Colonoscopy with propofol (N/A, 08/30/2015); and polypectomy (08/30/2015). Her family history includes Breast cancer in her sister. She  reports that she has never smoked. She has never used smokeless tobacco. She reports that she drinks alcohol. She reports that she does not use drugs. She has No Known Allergies.       Review of Systems:  Review of Systems  Constitutional: Denied constitutional symptoms, night sweats, recent illness, fatigue, fever, insomnia and weight loss.  Eyes: Denied eye symptoms, eye pain, photophobia, vision change and visual disturbance.  Ears/Nose/Throat/Neck: Denied ear, nose, throat or neck symptoms, hearing loss, nasal discharge, sinus congestion and sore throat.  Cardiovascular: Denied cardiovascular symptoms, arrhythmia, chest pain/pressure, edema, exercise intolerance, orthopnea and palpitations.  Respiratory: Denied pulmonary symptoms, asthma, pleuritic pain, productive sputum, cough, dyspnea and wheezing.  Gastrointestinal: Denied,  gastro-esophageal reflux, melena, nausea and vomiting.  Genitourinary: See HPI for additional information.  Musculoskeletal: Denied musculoskeletal symptoms, stiffness, swelling, muscle weakness and myalgia.  Dermatologic: Denied dermatology symptoms, rash and scar.  Neurologic: Denied neurology symptoms, dizziness, headache, neck pain and syncope.  Psychiatric: Denied psychiatric symptoms, anxiety and depression.  Endocrine: Denied endocrine symptoms including hot flashes and night sweats.   Meds:   Current Outpatient Prescriptions on File Prior to Visit  Medication Sig Dispense Refill  . cholecalciferol (VITAMIN D) 1000 UNITS tablet Take 1,000 Units by mouth daily.    . Multiple Vitamin (MULTIVITAMIN) tablet Take 1 tablet by mouth daily.    . metroNIDAZOLE (METROGEL VAGINAL) 0.75 % vaginal gel Place 1 Applicatorful vaginally at bedtime. For five nights (Patient not taking: Reported on 03/30/2017) 70 g 0   No current facility-administered medications on file prior to visit.     Objective:     Vitals:   03/30/17 1101  BP: (!) 182/113  Pulse: 70              Physical examination   Pelvic:   Vulva: Normal appearance.  No lesions.  Vagina: No lesions or abnormalities noted.  Thick white vaginal discharge   Support: Normal pelvic support.  Urethra No masses tenderness or scarring.  Meatus Normal size without lesions or prolapse.  Cervix: Normal appearance.  No lesions.  Anus: Normal exam.  No lesions.  Perineum: Normal exam.  No lesions.        Bimanual   Uterus: Surgically absent   Adnexae: No masses.  Non-tender to palpation.  Cul-de-sac: Negative for abnormality.   WET PREP: clue cells: absent, KOH (yeast): positive, odor: absent and trichomoniasis: negative Ph:  <  4.5   Assessment:    G1P1001 Patient Active Problem List   Diagnosis Date Noted  . BV (bacterial vaginosis) 12/22/2016  . Special screening for malignant neoplasms, colon   . Benign neoplasm of ascending  colon   . Benign neoplasm of transverse colon   . Benign neoplasm of sigmoid colon   . Elevated blood pressure 07/31/2015  . History of hysterectomy, supracervical 07/31/2015     1. Monilial vulvovaginitis        Plan:            1.  Monilia discussed. I will prescribe Diflucan. Orders No orders of the defined types were placed in this encounter.    Meds ordered this encounter  Medications  . fluconazole (DIFLUCAN) 150 MG tablet    Sig: Take 1 tablet (150 mg total) by mouth once a week. Can take additional dose three days later if symptoms persist    Dispense:  2 tablet    Refill:  0        F/U  No Follow-up on file.  Finis Bud, M.D. 03/30/2017 11:58 AM

## 2017-06-15 ENCOUNTER — Telehealth: Payer: Self-pay | Admitting: Obstetrics and Gynecology

## 2017-06-15 MED ORDER — METRONIDAZOLE 0.75 % VA GEL: 1.0000 | Freq: Every day | VAGINAL | 0 refills | Status: DC

## 2017-06-15 NOTE — Telephone Encounter (Signed)
Pt states she has vaginal d/c and odor. NO itching or burning. Per mad ok to give metrogel. Pt aware med erx.

## 2017-06-15 NOTE — Telephone Encounter (Signed)
Patient has BV again - please call

## 2017-08-05 ENCOUNTER — Encounter: Payer: Self-pay | Admitting: Obstetrics and Gynecology

## 2017-08-05 ENCOUNTER — Ambulatory Visit (INDEPENDENT_AMBULATORY_CARE_PROVIDER_SITE_OTHER): Payer: 59 | Admitting: Obstetrics and Gynecology

## 2017-08-05 VITALS — BP 153/99 | HR 86 | Ht 67.0 in | Wt 173.4 lb

## 2017-08-05 DIAGNOSIS — Z1239 Encounter for other screening for malignant neoplasm of breast: Secondary | ICD-10-CM

## 2017-08-05 DIAGNOSIS — Z90711 Acquired absence of uterus with remaining cervical stump: Secondary | ICD-10-CM | POA: Diagnosis not present

## 2017-08-05 DIAGNOSIS — Z1231 Encounter for screening mammogram for malignant neoplasm of breast: Secondary | ICD-10-CM

## 2017-08-05 DIAGNOSIS — Z1211 Encounter for screening for malignant neoplasm of colon: Secondary | ICD-10-CM

## 2017-08-05 DIAGNOSIS — Z01419 Encounter for gynecological examination (general) (routine) without abnormal findings: Secondary | ICD-10-CM | POA: Diagnosis not present

## 2017-08-05 NOTE — Patient Instructions (Addendum)
1.  Pap smear is done 2.  Mammogram is ordered 3.  Stool guaiac cards are given for colon cancer screening 4.  Screening labs are obtained through work-already completed 5.  Continue with healthy eating and exercise 6.  Continue with calcium supplementation 1200 mg a day total 7.  Return in 1 year for annual exam 8.  Consider ERT if night sweats or vasomotor symptoms worsen   Health Maintenance for Postmenopausal Women Menopause is a normal process in which your reproductive ability comes to an end. This process happens gradually over a span of months to years, usually between the ages of 31 and 75. Menopause is complete when you have missed 12 consecutive menstrual periods. It is important to talk with your health care provider about some of the most common conditions that affect postmenopausal women, such as heart disease, cancer, and bone loss (osteoporosis). Adopting a healthy lifestyle and getting preventive care can help to promote your health and wellness. Those actions can also lower your chances of developing some of these common conditions. What should I know about menopause? During menopause, you may experience a number of symptoms, such as:  Moderate-to-severe hot flashes.  Night sweats.  Decrease in sex drive.  Mood swings.  Headaches.  Tiredness.  Irritability.  Memory problems.  Insomnia.  Choosing to treat or not to treat menopausal changes is an individual decision that you make with your health care provider. What should I know about hormone replacement therapy and supplements? Hormone therapy products are effective for treating symptoms that are associated with menopause, such as hot flashes and night sweats. Hormone replacement carries certain risks, especially as you become older. If you are thinking about using estrogen or estrogen with progestin treatments, discuss the benefits and risks with your health care provider. What should I know about heart disease  and stroke? Heart disease, heart attack, and stroke become more likely as you age. This may be due, in part, to the hormonal changes that your body experiences during menopause. These can affect how your body processes dietary fats, triglycerides, and cholesterol. Heart attack and stroke are both medical emergencies. There are many things that you can do to help prevent heart disease and stroke:  Have your blood pressure checked at least every 1-2 years. High blood pressure causes heart disease and increases the risk of stroke.  If you are 78-68 years old, ask your health care provider if you should take aspirin to prevent a heart attack or a stroke.  Do not use any tobacco products, including cigarettes, chewing tobacco, or electronic cigarettes. If you need help quitting, ask your health care provider.  It is important to eat a healthy diet and maintain a healthy weight. ? Be sure to include plenty of vegetables, fruits, low-fat dairy products, and lean protein. ? Avoid eating foods that are high in solid fats, added sugars, or salt (sodium).  Get regular exercise. This is one of the most important things that you can do for your health. ? Try to exercise for at least 150 minutes each week. The type of exercise that you do should increase your heart rate and make you sweat. This is known as moderate-intensity exercise. ? Try to do strengthening exercises at least twice each week. Do these in addition to the moderate-intensity exercise.  Know your numbers.Ask your health care provider to check your cholesterol and your blood glucose. Continue to have your blood tested as directed by your health care provider.  What should  I know about cancer screening? There are several types of cancer. Take the following steps to reduce your risk and to catch any cancer development as early as possible. Breast Cancer  Practice breast self-awareness. ? This means understanding how your breasts normally  appear and feel. ? It also means doing regular breast self-exams. Let your health care provider know about any changes, no matter how small.  If you are 20 or older, have a clinician do a breast exam (clinical breast exam or CBE) every year. Depending on your age, family history, and medical history, it may be recommended that you also have a yearly breast X-ray (mammogram).  If you have a family history of breast cancer, talk with your health care provider about genetic screening.  If you are at high risk for breast cancer, talk with your health care provider about having an MRI and a mammogram every year.  Breast cancer (BRCA) gene test is recommended for women who have family members with BRCA-related cancers. Results of the assessment will determine the need for genetic counseling and BRCA1 and for BRCA2 testing. BRCA-related cancers include these types: ? Breast. This occurs in males or females. ? Ovarian. ? Tubal. This may also be called fallopian tube cancer. ? Cancer of the abdominal or pelvic lining (peritoneal cancer). ? Prostate. ? Pancreatic.  Cervical, Uterine, and Ovarian Cancer Your health care provider may recommend that you be screened regularly for cancer of the pelvic organs. These include your ovaries, uterus, and vagina. This screening involves a pelvic exam, which includes checking for microscopic changes to the surface of your cervix (Pap test).  For women ages 21-65, health care providers may recommend a pelvic exam and a Pap test every three years. For women ages 66-65, they may recommend the Pap test and pelvic exam, combined with testing for human papilloma virus (HPV), every five years. Some types of HPV increase your risk of cervical cancer. Testing for HPV may also be done on women of any age who have unclear Pap test results.  Other health care providers may not recommend any screening for nonpregnant women who are considered low risk for pelvic cancer and have no  symptoms. Ask your health care provider if a screening pelvic exam is right for you.  If you have had past treatment for cervical cancer or a condition that could lead to cancer, you need Pap tests and screening for cancer for at least 20 years after your treatment. If Pap tests have been discontinued for you, your risk factors (such as having a new sexual partner) need to be reassessed to determine if you should start having screenings again. Some women have medical problems that increase the chance of getting cervical cancer. In these cases, your health care provider may recommend that you have screening and Pap tests more often.  If you have a family history of uterine cancer or ovarian cancer, talk with your health care provider about genetic screening.  If you have vaginal bleeding after reaching menopause, tell your health care provider.  There are currently no reliable tests available to screen for ovarian cancer.  Lung Cancer Lung cancer screening is recommended for adults 53-74 years old who are at high risk for lung cancer because of a history of smoking. A yearly low-dose CT scan of the lungs is recommended if you:  Currently smoke.  Have a history of at least 30 pack-years of smoking and you currently smoke or have quit within the past 15 years.  A pack-year is smoking an average of one pack of cigarettes per day for one year.  Yearly screening should:  Continue until it has been 15 years since you quit.  Stop if you develop a health problem that would prevent you from having lung cancer treatment.  Colorectal Cancer  This type of cancer can be detected and can often be prevented.  Routine colorectal cancer screening usually begins at age 55 and continues through age 29.  If you have risk factors for colon cancer, your health care provider may recommend that you be screened at an earlier age.  If you have a family history of colorectal cancer, talk with your health care  provider about genetic screening.  Your health care provider may also recommend using home test kits to check for hidden blood in your stool.  A small camera at the end of a tube can be used to examine your colon directly (sigmoidoscopy or colonoscopy). This is done to check for the earliest forms of colorectal cancer.  Direct examination of the colon should be repeated every 5-10 years until age 29. However, if early forms of precancerous polyps or small growths are found or if you have a family history or genetic risk for colorectal cancer, you may need to be screened more often.  Skin Cancer  Check your skin from head to toe regularly.  Monitor any moles. Be sure to tell your health care provider: ? About any new moles or changes in moles, especially if there is a change in a mole's shape or color. ? If you have a mole that is larger than the size of a pencil eraser.  If any of your family members has a history of skin cancer, especially at a young age, talk with your health care provider about genetic screening.  Always use sunscreen. Apply sunscreen liberally and repeatedly throughout the day.  Whenever you are outside, protect yourself by wearing long sleeves, pants, a wide-brimmed hat, and sunglasses.  What should I know about osteoporosis? Osteoporosis is a condition in which bone destruction happens more quickly than new bone creation. After menopause, you may be at an increased risk for osteoporosis. To help prevent osteoporosis or the bone fractures that can happen because of osteoporosis, the following is recommended:  If you are 30-79 years old, get at least 1,000 mg of calcium and at least 600 mg of vitamin D per day.  If you are older than age 73 but younger than age 32, get at least 1,200 mg of calcium and at least 600 mg of vitamin D per day.  If you are older than age 48, get at least 1,200 mg of calcium and at least 800 mg of vitamin D per day.  Smoking and excessive  alcohol intake increase the risk of osteoporosis. Eat foods that are rich in calcium and vitamin D, and do weight-bearing exercises several times each week as directed by your health care provider. What should I know about how menopause affects my mental health? Depression may occur at any age, but it is more common as you become older. Common symptoms of depression include:  Low or sad mood.  Changes in sleep patterns.  Changes in appetite or eating patterns.  Feeling an overall lack of motivation or enjoyment of activities that you previously enjoyed.  Frequent crying spells.  Talk with your health care provider if you think that you are experiencing depression. What should I know about immunizations? It is important that you  get and maintain your immunizations. These include:  Tetanus, diphtheria, and pertussis (Tdap) booster vaccine.  Influenza every year before the flu season begins.  Pneumonia vaccine.  Shingles vaccine.  Your health care provider may also recommend other immunizations. This information is not intended to replace advice given to you by your health care provider. Make sure you discuss any questions you have with your health care provider. Document Released: 11/06/2005 Document Revised: 04/03/2016 Document Reviewed: 06/18/2015 Elsevier Interactive Patient Education  2018 [employer].  Vaginal Yeast infection, Adult Vaginal yeast infection is a condition that causes soreness, swelling, and redness (inflammation) of the vagina. It also causes vaginal discharge. This is a common condition. Some women get this infection frequently. What are the causes? This condition is caused by a change in the normal balance of the yeast (candida) and bacteria that live in the vagina. This change causes an overgrowth of yeast, which causes the inflammation. What increases the risk? This condition is more likely to develop in:  Women who take antibiotic medicines.  Women  who have diabetes.  Women who take birth control pills.  Women who are pregnant.  Women who douche often.  Women who have a weak defense (immune) system.  Women who have been taking steroid medicines for a long time.  Women who frequently wear tight clothing.  What are the signs or symptoms? Symptoms of this condition include:  White, thick vaginal discharge.  Swelling, itching, redness, and irritation of the vagina. The lips of the vagina (vulva) may be affected as well.  Pain or a burning feeling while urinating.  Pain during sex.  How is this diagnosed? This condition is diagnosed with a medical history and physical exam. This will include a pelvic exam. Your health care provider will examine a sample of your vaginal discharge under a microscope. Your health care provider may send this sample for testing to confirm the diagnosis. How is this treated? This condition is treated with medicine. Medicines may be over-the-counter or prescription. You may be told to use one or more of the following:  Medicine that is taken orally.  Medicine that is applied as a cream.  Medicine that is inserted directly into the vagina (suppository).  Follow these instructions at home:  Take or apply over-the-counter and prescription medicines only as told by your health care provider.  Do not have sex until your health care provider has approved. Tell your sex partner that you have a yeast infection. That person should go to his or her health care provider if he or she develops symptoms.  Do not wear tight clothes, such as pantyhose or tight pants.  Avoid using tampons until your health care provider approves.  Eat more yogurt. This may help to keep your yeast infection from returning.  Try taking a sitz bath to help with discomfort. This is a warm water bath that is taken while you are sitting down. The water should only come up to your hips and should cover your buttocks. Do this 3-4  times per day or as told by your health care provider.  Do not douche.  Wear breathable, cotton underwear.  If you have diabetes, keep your blood sugar levels under control. Contact a health care provider if:  You have a fever.  Your symptoms go away and then return.  Your symptoms do not get better with treatment.  Your symptoms get worse.  You have new symptoms.  You develop blisters in or around your vagina.  You have blood coming from your vagina and it is not your menstrual period.  You develop pain in your abdomen. This information is not intended to replace advice given to you by your health care provider. Make sure you discuss any questions you have with your health care provider. Document Released: 06/24/2005 Document Revised: 02/26/2016 Document Reviewed: 03/18/2015 Elsevier Interactive Patient Education  2018 [employer]  Bacterial Vaginosis Bacterial vaginosis is an infection of the vagina. It happens when too many germs (bacteria) grow in the vagina. This infection puts you at risk for infections from sex (STIs). Treating this infection can lower your risk for some STIs. You should also treat this if you are pregnant. It can cause your baby to be born early. Follow these instructions at home: Medicines  Take over-the-counter and prescription medicines only as told by your doctor.  Take or use your antibiotic medicine as told by your doctor. Do not stop taking or using it even if you start to feel better. General instructions  If you your sexual partner is a woman, tell her that you have this infection. She needs to get treatment if she has symptoms. If you have a female partner, he does not need to be treated.  During treatment: ? Avoid sex. ? Do not douche. ? Avoid alcohol as told. ? Avoid breastfeeding as told.  Drink enough fluid to keep your pee (urine) clear or pale yellow.  Keep your vagina and butt (rectum) clean. ? Wash the area with warm water  every day. ? Wipe from front to back after you use the toilet.  Keep all follow-up visits as told by your doctor. This is important. Preventing this condition  Do not douche.  Use only warm water to wash around your vagina.  Use protection when you have sex. This includes: ? Latex condoms. ? Dental dams.  Limit how many people you have sex with. It is best to only have sex with the same person (be monogamous).  Get tested for STIs. Have your partner get tested.  Wear underwear that is cotton or lined with cotton.  Avoid tight pants and pantyhose. This is most important in summer.  Do not use any products that have nicotine or tobacco in them. These include cigarettes and e-cigarettes. If you need help quitting, ask your doctor.  Do not use illegal drugs.  Limit how much alcohol you drink. Contact a doctor if:  Your symptoms do not get better, even after you are treated.  You have more discharge or pain when you pee (urinate).  You have a fever.  You have pain in your belly (abdomen).  You have pain with sex.  Your bleed from your vagina between periods. Summary  This infection happens when too many germs (bacteria) grow in the vagina.  Treating this condition can lower your risk for some infections from sex (STIs).  You should also treat this if you are pregnant. It can cause early (premature) birth.  Do not stop taking or using your antibiotic medicine even if you start to feel better. This information is not intended to replace advice given to you by your health care provider. Make sure you discuss any questions you have with your health care provider. Document Released: 06/23/2008 Document Revised: 05/30/2016 Document Reviewed: 05/30/2016 Elsevier Interactive Patient Education  2017 [employer].

## 2017-08-05 NOTE — Progress Notes (Signed)
ANNUAL PREVENTATIVE CARE GYN  ENCOUNTER NOTE  Subjective:       Tara [name] is a 51 y.o. G108P1001 female here for a routine annual gynecologic exam.  Current complaints: 1.  None  Patient reports good year with no major interval health issues. Bowel function and bladder function are normal. Patient has occasional night sweats without hot flashes.  If she is not desiring any treatment for this at present. Screening labs are obtained through lab core-place of employment.   Gynecologic History No LMP recorded. Patient has had a hysterectomy. Contraception: status post hysterectomy Last Pap: 2015 neg/neg. Results were: normal Last mammogram:2017 . Results were: normal  Obstetric History OB History  Gravida Para Term Preterm AB Living  1 1 1     1   SAB TAB Ectopic Multiple Live Births          1    # Outcome Date GA Lbr Len/2nd Weight Sex Delivery Anes PTL Lv  1 Term    6 lb 14.4 oz (3.13 kg) F Vag-Spont   LIV      Past Medical History:  Diagnosis Date  . Breast lump in female    rt side 10 oclock  . Elevated BP   . Endometriosis of intestine    Sigmoid colon polyp  . Fibroid    h/o  . Night sweats   . Perimenopausal   . Vaginal Pap smear, abnormal    ascus.pos  . Wears contact lenses     Past Surgical History:  Procedure Laterality Date  . ABDOMINAL HYSTERECTOMY  2008   lsh/rso  . BREAST SURGERY Right    lumpectomy     Current Outpatient Medications on File Prior to Visit  Medication Sig Dispense Refill  . cholecalciferol (VITAMIN D) 1000 UNITS tablet Take 1,000 Units by mouth daily.    . Collagen 500 MG CAPS Take by mouth.    . Multiple Vitamin (MULTIVITAMIN) tablet Take 1 tablet by mouth daily.     No current facility-administered medications on file prior to visit.     No Known Allergies  Social History   Socioeconomic History  . Marital status: Single    Spouse name: Not on file  . Number of children: Not on file  . Years of education: Not on  file  . Highest education level: Not on file  Social Needs  . Financial resource strain: Not on file  . Food insecurity - worry: Not on file  . Food insecurity - inability: Not on file  . Transportation needs - medical: Not on file  . Transportation needs - non-medical: Not on file  Occupational History  . Not on file  Tobacco Use  . Smoking status: Never Smoker  . Smokeless tobacco: Never Used  Substance and Sexual Activity  . Alcohol use: Yes    Comment: rare  . Drug use: No  . Sexual activity: Yes    Birth control/protection: Surgical  Other Topics Concern  . Not on file  Social History Narrative  . Not on file    Family History  Problem Relation Age of Onset  . Breast cancer Sister   . Diabetes Neg Hx   . Heart disease Neg Hx   . Colon cancer Neg Hx   . Ovarian cancer Neg Hx     The following portions of the patient's history were reviewed and updated as appropriate: allergies, current medications, past family history, past medical history, past social history, past surgical history and problem  list.  Review of Systems Review of Systems  Constitutional:       Mild nasal congestion Occasional night sweats  HENT: Negative.   Eyes: Negative.   Cardiovascular: Negative.   Gastrointestinal: Negative.   Genitourinary: Negative.   Musculoskeletal: Negative.   Skin: Negative.   Neurological: Negative.   Endo/Heme/Allergies: Negative.   Psychiatric/Behavioral: Negative.    Objective:   BP (!) 153/99   Pulse 86   Ht 5\' 7"  (1.702 m)   Wt 173 lb 6.4 oz (78.7 kg)   BMI 27.16 kg/m  CONSTITUTIONAL: Well-developed, well-nourished female in no acute distress.  PSYCHIATRIC: Normal mood and affect. Normal behavior. Normal judgment and thought content. [place]: Alert and oriented to person, place, and time. Normal muscle tone coordination. No cranial nerve deficit noted. HENT:  Normocephalic, atraumatic, External right and left ear normal. Oropharynx is clear and  moist EYES: Conjunctivae and EOM are normal. No scleral icterus.  NECK: Normal range of motion, supple, no masses.  Normal thyroid.  SKIN: Skin is warm and dry. No rash noted. Not diaphoretic. No erythema. No pallor. CARDIOVASCULAR: Normal heart rate noted, regular rhythm, no murmur. RESPIRATORY: Clear to auscultation bilaterally. Effort and breath sounds normal, no problems with respiration noted. BREASTS: Symmetric in size. No masses, skin changes, nipple drainage, or lymphadenopathy. ABDOMEN: Soft, normal bowel sounds, no distention noted.  No tenderness, rebound or guarding.  BLADDER: Normal PELVIC:  External Genitalia: Normal  BUS: Normal  Vagina: Normal; good estrogen effect  Cervix: Normal; stenotic os; no cervical motion tenderness  Uterus: Surgically absent  Adnexa: Normal; nonpalpable nontender  RV: External Exam NormaI, No Rectal Masses and Normal Sphincter tone  MUSCULOSKELETAL: Normal range of motion. No tenderness.  No cyanosis, clubbing, or edema.  2+ distal pulses. LYMPHATIC: No Axillary, Supraclavicular, or Inguinal Adenopathy.    Assessment:   Annual gynecologic examination 51 y.o. Contraception: status post hysterectomy [id number] Night sweats, minimally symptomatic  Plan:  Pap: Pap Co Test Mammogram: Ordered Stool Guaiac Testing:  Ordered Labs: thru employer Routine preventative health maintenance measures emphasized: Exercise/Diet/Weight control, Tobacco Warnings, Alcohol/Substance use risks and Stress Management Continue with massages to destress Return to [place], CMA  Brayton Mars, MD  Note: This dictation was prepared with Dragon dictation along with smaller phrase technology. Any transcriptional errors that result from this process are unintentional.

## 2017-08-10 LAB — IGP, COBASHPV16/18
HPV 16: NEGATIVE
HPV 18: NEGATIVE
HPV OTHER HR TYPES: NEGATIVE
PAP SMEAR COMMENT: 0

## 2017-08-10 LAB — SPECIMEN STATUS REPORT

## 2017-08-11 ENCOUNTER — Telehealth: Payer: Self-pay

## 2017-08-11 DIAGNOSIS — R195 Other fecal abnormalities: Secondary | ICD-10-CM

## 2017-08-11 LAB — FECAL OCCULT BLOOD, IMMUNOCHEMICAL: Fecal Occult Bld: POSITIVE — AB

## 2017-08-11 NOTE — Telephone Encounter (Signed)
-----   Message from Brayton Mars, MD sent at 08/11/2017 10:03 AM EST ----- Please notify - Abnormal Labs Please schedule GI referral due to positive Hemoccult

## 2017-08-11 NOTE — Telephone Encounter (Signed)
Pt aware- Referral placed.  

## 2017-09-09 ENCOUNTER — Encounter: Payer: Self-pay | Admitting: Gastroenterology

## 2017-09-09 ENCOUNTER — Ambulatory Visit: Payer: 59 | Admitting: Gastroenterology

## 2017-09-09 VITALS — BP 198/103 | HR 90 | Ht 67.0 in | Wt 176.0 lb

## 2017-09-09 DIAGNOSIS — K921 Melena: Secondary | ICD-10-CM

## 2017-09-09 NOTE — Progress Notes (Signed)
Primary Care Physician: Patient, No Pcp Per  Primary Gastroenterologist:  Dr. Lucilla Lame  Chief Complaint  Patient presents with  . fecal occult blood    HPI: Tara [name] is a 51 y.o. female here for heme positives stools. The patient was found to have heme-positive stools on stool cards that she was sent home with. The patient had a colonoscopy a little over 2 years ago by me that showed some hyperplastic polyps and a lesion that was removed that was found to be endometriosis. The patient states that she had a hysterectomy back in 2008. She denies any unexplained weight loss fevers chills nausea or vomiting. The patient states that when she did the stool tests she was straining heavily because she was in a rush to get to work. She believes that it may have caused her known hemorrhoids 2 be irritated. She denies any family history of colon cancer colon polyps. The patient also reports that she had lab work done at [facility] which showed her hemoglobin to be normal. I do not have those results but she states that she has them.  Current Outpatient Medications  Medication Sig Dispense Refill  . cholecalciferol (VITAMIN D) 1000 UNITS tablet Take 1,000 Units by mouth daily.    . Collagen 500 MG CAPS Take by mouth.    . Multiple Vitamin (MULTIVITAMIN) tablet Take 1 tablet by mouth daily.     No current facility-administered medications for this visit.     Allergies as of 09/09/2017  . (No Known Allergies)    ROS:  General: Negative for anorexia, weight loss, fever, chills, fatigue, weakness. ENT: Negative for hoarseness, difficulty swallowing , nasal congestion. CV: Negative for chest pain, angina, palpitations, dyspnea on exertion, peripheral edema.  Respiratory: Negative for dyspnea at rest, dyspnea on exertion, cough, sputum, wheezing.  GI: See history of present illness. GU:  Negative for dysuria, hematuria, urinary incontinence, urinary frequency, nocturnal urination.  Endo:  Negative for unusual weight change.    Physical Examination:   BP (!) 198/103   Pulse 90   Ht 5\' 7"  (1.702 m)   Wt 176 lb (79.8 kg)   BMI 27.57 kg/m   General: Well-nourished, well-developed in no acute distress.  Eyes: No icterus. Conjunctivae pink. Mouth: Oropharyngeal mucosa moist and pink , no lesions erythema or exudate. Lungs: Clear to auscultation bilaterally. Non-labored. Heart: Regular rate and rhythm, no murmurs rubs or gallops.  Abdomen: Bowel sounds are normal, nontender, nondistended, no hepatosplenomegaly or masses, no abdominal bruits or hernia , no rebound or guarding.   Extremities: No lower extremity edema. No clubbing or deformities. Neuro: Alert and oriented x 3.  Grossly intact. Skin: Warm and dry, no jaundice.   Psych: Alert and cooperative, normal mood and affect.  Labs:    Imaging Studies: No results found.  Assessment and Plan:   Tara [name] is a 51 y.o. y/o female who comes in with heme-positive stools on a stool card. The patient had a colonoscopy less than 3 years ago by me with hyperplastic polyps and a polyp that was found to be endometriosis. Her heme positive stools may be caused by recurrent endometriosis versus hemorrhoids which she knows she hasn't states she was straining very hard to have a stool sample done before she left for work that day. Since the patient has no worrisome symptoms such as change in bowel habits and explain weight loss nausea vomiting fevers chills or abdominal pain I do not believe that  the patient needs to undergo a repeat colonoscopy so soon after her last one. This in addition to her informing me that her blood count has been normal. The patient will continue to monitor if there is a change in any of her symptoms and contact me if there are. The patient has been explained the plan and agrees with it.    Lucilla Lame, MD. [name]   Note: This dictation was prepared with Dragon dictation along with smaller phrase  technology. Any transcriptional errors that result from this process are unintentional.

## 2017-09-29 ENCOUNTER — Telehealth: Payer: Self-pay | Admitting: Obstetrics and Gynecology

## 2017-09-29 NOTE — Telephone Encounter (Signed)
The patient called and stated that she has a few questions for Dr. Duard Brady nurse and would like a call back. No other information was disclosed. Please advise.

## 2017-09-30 NOTE — Telephone Encounter (Signed)
lmtrc

## 2017-09-30 NOTE — Telephone Encounter (Signed)
Pt would like something for vaginal dryness and lubes for sex. Standard lube, right? Premarin??? pls advise.

## 2017-10-01 NOTE — Telephone Encounter (Signed)
Pt aware.

## 2017-10-11 ENCOUNTER — Other Ambulatory Visit: Payer: Self-pay

## 2017-10-11 ENCOUNTER — Encounter: Payer: Self-pay | Admitting: Obstetrics and Gynecology

## 2017-10-11 MED ORDER — FLUCONAZOLE 150 MG PO TABS
150.0000 mg | ORAL_TABLET | Freq: Every day | ORAL | 0 refills | Status: DC
Start: 2017-10-11 — End: 2017-10-14

## 2017-10-14 ENCOUNTER — Encounter: Payer: Self-pay | Admitting: Obstetrics and Gynecology

## 2017-10-14 ENCOUNTER — Ambulatory Visit: Payer: 59 | Admitting: Obstetrics and Gynecology

## 2017-10-14 VITALS — BP 164/88 | HR 82 | Ht 67.0 in | Wt 170.0 lb

## 2017-10-14 DIAGNOSIS — B373 Candidiasis of vulva and vagina: Secondary | ICD-10-CM | POA: Diagnosis not present

## 2017-10-14 DIAGNOSIS — B3731 Acute candidiasis of vulva and vagina: Secondary | ICD-10-CM

## 2017-10-14 MED ORDER — TERCONAZOLE 0.4 % VA CREA: 1.0000 | TOPICAL_CREAM | Freq: Every day | VAGINAL | 0 refills | Status: DC

## 2017-10-14 NOTE — Progress Notes (Signed)
Chief complaint: 1.  Vaginitis  Patient reports persistent vulvovaginal itching.  She has previously had yeast infections treated.  3 days ago a Diflucan tablet was given.  Patient states that her symptoms have lessened but have not stopped. She has discontinued taking her Activia in the recent past. No recent antibiotic use.  Past medical history, past surgical history, problem list, medications, and allergies are reviewed  OBJECTIVE: BP (!) 164/88   Pulse 82   Ht 5\' 7"  (1.702 m)   Wt 170 lb (77.1 kg)   BMI 26.63 kg/m   Pleasant well-appearing female no acute distress. Pelvic exam: External genitalia-normal BUS-normal Vagina-patchy white discharge noted; no mucosal lesions Cervix-no discharge Bimanual-deferred  PROCEDURE: Wet prep Normal saline-negative for clue cells, white blood cells, or Trichomonas KOH-no hyphae seen  ASSESSMENT: 1.  Symptomatic vulvovaginitis, partially treated monilia  PLAN: 1.  Wet prep is noted 2.  Terazol 7 cream intravaginal nightly for 7 days 3.  Return in 2 weeks if symptoms persist  Brayton Mars, MD  Note: This dictation was prepared with Dragon dictation along with smaller phrase technology. Any transcriptional errors that result from this process are unintentional.

## 2017-10-14 NOTE — Patient Instructions (Signed)
1.  Use Terazol 7 cream intravaginal nightly times 7 days 2.  Return in 2 weeks if symptoms not resolved

## 2018-08-10 NOTE — Progress Notes (Signed)
ANNUAL PREVENTATIVE CARE GYN  ENCOUNTER NOTE  Subjective:       Tara [name] is a 52 y.o. G45P1001 female here for a routine annual gynecologic exam.  Current complaints: 1.  Patient stated that she does not have any energy.  She has not been exercising regularly as in the past.  She is sleeping well.  Mild night sweats are noted but they do not impair her sleep.  Patient reports good year with no major interval health issues. Bowel function and bladder function are normal. Patient has occasional night sweats without hot flashes.  If she is not desiring any treatment for this at present. Screening labs are obtained through [facility] of [facility].  Her labs will be sent over for review to see if we have an etiology for her low energy/fatigue.   Gynecologic History No LMP recorded. Patient has had a [place] RSO Contraception: Status post [place] RSO Last Pap: 2018 neg/neg. Results were: normal Last mammogram:09/15/2017 . Results were: abnormal. US performed.   Obstetric History OB History  Gravida Para Term Preterm AB Living  1 1 1     1   SAB TAB Ectopic Multiple Live Births          1    # Outcome Date GA Lbr Len/2nd Weight Sex Delivery Anes PTL Lv  1 Term    6 lb 14.4 oz (3.13 kg) F Vag-Spont   LIV    Past Medical History:  Diagnosis Date  . Breast lump in female    rt side 10 oclock  . Elevated BP   . Endometriosis of intestine    Sigmoid colon polyp  . Fibroid    h/o  . Night sweats   . Perimenopausal   . Vaginal Pap smear, abnormal    ascus.pos  . Wears contact lenses     Past Surgical History:  Procedure Laterality Date  . ABDOMINAL HYSTERECTOMY  2008   lsh/rso  . BREAST SURGERY Right    lumpectomy   . COLONOSCOPY WITH PROPOFOL N/A 08/30/2015   Procedure: COLONOSCOPY WITH PROPOFOL;  Surgeon: Lucilla Lame, MD;  Location: [place];  Service: Endoscopy;  Laterality: N/A;  . POLYPECTOMY  08/30/2015   Procedure: POLYPECTOMY;  Surgeon: Lucilla Lame, MD;   Location: [place];  Service: Endoscopy;;    Current Outpatient Medications on File Prior to Visit  Medication Sig Dispense Refill  . cholecalciferol (VITAMIN D) 1000 UNITS tablet Take 1,000 Units by mouth daily.    . Collagen 500 MG CAPS Take by mouth.    . Multiple Vitamin (MULTIVITAMIN) tablet Take 1 tablet by mouth daily.    Marland Kitchen terconazole (TERAZOL 7) 0.4 % vaginal cream Place 1 applicator vaginally at bedtime. 45 g 0   No current facility-administered medications on file prior to visit.     No Known Allergies  Social History   Socioeconomic History  . Marital status: Single    Spouse name: Not on file  . Number of children: Not on file  . Years of education: Not on file  . Highest education level: Not on file  Occupational History  . Not on file  Social Needs  . Financial resource strain: Not on file  . Food insecurity:    Worry: Not on file    Inability: Not on file  . Transportation needs:    Medical: Not on file    Non-medical: Not on file  Tobacco Use  . Smoking status: Never Smoker  . Smokeless  tobacco: Never Used  Substance and Sexual Activity  . Alcohol use: Yes    Comment: rare  . Drug use: No  . Sexual activity: Not Currently    Birth control/protection: Surgical  Lifestyle  . Physical activity:    Days per week: 2 days    Minutes per session: 30 min  . Stress: Not at all  Relationships  . Social connections:    Talks on phone: Not on file    Gets together: Not on file    Attends religious service: Not on file    Active member of club or organization: Not on file    Attends meetings of clubs or organizations: Not on file    Relationship status: Not on file  . Intimate partner violence:    Fear of current or ex partner: No    Emotionally abused: No    Physically abused: No    Forced sexual activity: No  Other Topics Concern  . Not on file  Social History Narrative  . Not on file    Family History  Problem Relation Age of Onset   . Breast cancer Sister   . Diabetes Neg Hx   . Heart disease Neg Hx   . Colon cancer Neg Hx   . Ovarian cancer Neg Hx     The following portions of the patient's history were reviewed and updated as appropriate: allergies, current medications, past family history, past medical history, past social history, past surgical history and problem list.  Review of Systems Review of Systems  Constitutional:       Occasional night sweats, not affecting sleep Rare hot flash  Respiratory: Negative.   Cardiovascular: Negative.   Gastrointestinal: Negative.        Stool guaiac card was positive last year; patient followed up with Dr. Allen Norris in GI, who did not recommend repeat colonoscopy due to recent normal findings  Genitourinary: Negative.   Musculoskeletal: Negative.   All other systems reviewed and are negative.   Objective:   BP (!) 152/79   Ht 5\' 7"  (1.702 m)   Wt 176 lb 14.4 oz (80.2 kg)   BMI 27.71 kg/m  CONSTITUTIONAL: Well-developed, well-nourished female in no acute distress.  PSYCHIATRIC: Normal mood and affect. Normal behavior. Normal judgment and thought content. [place]: Alert and oriented to person, place, and time. Normal muscle tone coordination. No cranial nerve deficit noted. HENT:  Normocephalic, atraumatic, External right and left ear normal.  EYES: Conjunctivae and EOM are normal. No scleral icterus.  NECK: Normal range of motion, supple, no masses.  Normal thyroid.  SKIN: Skin is warm and dry. No rash noted. Not diaphoretic. No erythema. No pallor. CARDIOVASCULAR: Normal heart rate noted, regular rhythm, no murmur. RESPIRATORY: Clear to auscultation bilaterally. Effort and breath sounds normal, no problems with respiration noted. BREASTS: Symmetric in size. No masses, skin changes, nipple drainage, or lymphadenopathy.  Right breast incision at 9 o'clock position is well-healed ABDOMEN: Soft, no distention noted.  No tenderness, rebound or guarding.  BLADDER:  Normal PELVIC:  External Genitalia: Normal  BUS: Normal  Vagina: Normal; good estrogen effect; good vault support  Cervix: Normal; stenotic os; no cervical motion tenderness  Uterus: Surgically absent  Adnexa: Normal; nonpalpable nontender  RV: External Exam NormaI, No Rectal Masses and Normal Sphincter tone  MUSCULOSKELETAL: Normal range of motion. No tenderness.  No cyanosis, clubbing, or edema.  2+ distal pulses. LYMPHATIC: No Axillary, Supraclavicular, or Inguinal Adenopathy.    Assessment:   Annual gynecologic  examination 52 y.o. Contraception: status post hysterectomy BMI: 27 Night sweats, minimally symptomatic Fatigue  Plan:  Pap: Due 2021 Mammogram: Ordered Stool Guaiac Testing:  Ordered Labs: thru employer these will be reviewed to assess for etiology of low energy/fatigue Routine preventative health maintenance measures emphasized: Exercise/Diet/Weight control, Tobacco Warnings, Alcohol/Substance use risks and Stress Management Continue with calcium and vitamin D supplementation along with multivitamin daily Increase exercise Consider ERT trial in future if low energy level and fatigue are not improved Return to [place], Grant acting as scribe for Dr. Enzo Bi. I have reviewed, updated, and concur with the data which has been scribed by Shaune Pascal, CMA Alanda Slim Jillaine Waren   Note: This dictation was prepared with Dragon dictation along with smaller phrase technology. Any transcriptional errors that result from this process are unintentional.

## 2018-08-11 ENCOUNTER — Encounter: Payer: Self-pay | Admitting: Obstetrics and Gynecology

## 2018-08-11 ENCOUNTER — Telehealth: Payer: Self-pay | Admitting: Surgical

## 2018-08-11 ENCOUNTER — Ambulatory Visit (INDEPENDENT_AMBULATORY_CARE_PROVIDER_SITE_OTHER): Payer: 59 | Admitting: Obstetrics and Gynecology

## 2018-08-11 VITALS — BP 152/79 | Ht 67.0 in | Wt 176.9 lb

## 2018-08-11 DIAGNOSIS — Z1211 Encounter for screening for malignant neoplasm of colon: Secondary | ICD-10-CM | POA: Diagnosis not present

## 2018-08-11 DIAGNOSIS — Z01419 Encounter for gynecological examination (general) (routine) without abnormal findings: Secondary | ICD-10-CM

## 2018-08-11 DIAGNOSIS — Z90711 Acquired absence of uterus with remaining cervical stump: Secondary | ICD-10-CM | POA: Diagnosis not present

## 2018-08-11 DIAGNOSIS — Z1239 Encounter for other screening for malignant neoplasm of breast: Secondary | ICD-10-CM | POA: Diagnosis not present

## 2018-08-11 DIAGNOSIS — R5383 Other fatigue: Secondary | ICD-10-CM

## 2018-08-11 DIAGNOSIS — Z23 Encounter for immunization: Secondary | ICD-10-CM

## 2018-08-11 NOTE — Addendum Note (Signed)
Addended by: Durwin Glaze on: 08/11/2018 03:51 PM   Modules accepted: Orders

## 2018-08-11 NOTE — Addendum Note (Signed)
Addended by: Durwin Glaze on: 08/11/2018 09:15 AM   Modules accepted: Orders

## 2018-08-11 NOTE — Telephone Encounter (Signed)
Notified patient that Dr. Enzo Bi said that her labs looked good that was drawn from her work. He wanted to do a CBC and TSH for fatigue. I have scheduled patient to come in to have labs drawn.

## 2018-08-11 NOTE — Patient Instructions (Addendum)
1.  Pap smear is not done.  Next Pap smear is due 2021 2.  Mammogram is ordered. 3.  Stool guaiac card testing for colon cancer screening is not ordered.  Last year stool guaiac cards were positive and follow-up with Dr. Allen Norris in GI was completed.  Because she had a normal colonoscopy, he is attributing the stool guaiac to be due to hemorrhoids and colon endometriosis previously diagnosed. 4.  Screening labs are obtained through employer.  They will be sent over for our review. 5.  Continue with healthy eating and exercise. 6.  Return in 1 year for annual exam-Dr. Amalia Hailey 7.  Tdap is given today.   Health Maintenance for Postmenopausal Women Menopause is a normal process in which your reproductive ability comes to an end. This process happens gradually over a span of months to years, usually between the ages of 65 and 34. Menopause is complete when you have missed 12 consecutive menstrual periods. It is important to talk with your health care provider about some of the most common conditions that affect postmenopausal women, such as heart disease, cancer, and bone loss (osteoporosis). Adopting a healthy lifestyle and getting preventive care can help to promote your health and wellness. Those actions can also lower your chances of developing some of these common conditions. What should I know about menopause? During menopause, you may experience a number of symptoms, such as:  Moderate-to-severe hot flashes.  Night sweats.  Decrease in sex drive.  Mood swings.  Headaches.  Tiredness.  Irritability.  Memory problems.  Insomnia.  Choosing to treat or not to treat menopausal changes is an individual decision that you make with your health care provider. What should I know about hormone replacement therapy and supplements? Hormone therapy products are effective for treating symptoms that are associated with menopause, such as hot flashes and night sweats. Hormone replacement carries  certain risks, especially as you become older. If you are thinking about using estrogen or estrogen with progestin treatments, discuss the benefits and risks with your health care provider. What should I know about heart disease and stroke? Heart disease, heart attack, and stroke become more likely as you age. This may be due, in part, to the hormonal changes that your body experiences during menopause. These can affect how your body processes dietary fats, triglycerides, and cholesterol. Heart attack and stroke are both medical emergencies. There are many things that you can do to help prevent heart disease and stroke:  Have your blood pressure checked at least every 1-2 years. High blood pressure causes heart disease and increases the risk of stroke.  If you are 79-23 years old, ask your health care provider if you should take aspirin to prevent a heart attack or a stroke.  Do not use any tobacco products, including cigarettes, chewing tobacco, or electronic cigarettes. If you need help quitting, ask your health care provider.  It is important to eat a healthy diet and maintain a healthy weight. ? Be sure to include plenty of vegetables, fruits, low-fat dairy products, and lean protein. ? Avoid eating foods that are high in solid fats, added sugars, or salt (sodium).  Get regular exercise. This is one of the most important things that you can do for your health. ? Try to exercise for at least 150 minutes each week. The type of exercise that you do should increase your heart rate and make you sweat. This is known as moderate-intensity exercise. ? Try to do strengthening exercises at least  twice each week. Do these in addition to the moderate-intensity exercise.  Know your numbers.Ask your health care provider to check your cholesterol and your blood glucose. Continue to have your blood tested as directed by your health care provider.  What should I know about cancer screening? There are  several types of cancer. Take the following steps to reduce your risk and to catch any cancer development as early as possible. Breast Cancer  Practice breast self-awareness. ? This means understanding how your breasts normally appear and feel. ? It also means doing regular breast self-exams. Let your health care provider know about any changes, no matter how small.  If you are 41 or older, have a clinician do a breast exam (clinical breast exam or CBE) every year. Depending on your age, family history, and medical history, it may be recommended that you also have a yearly breast X-ray (mammogram).  If you have a family history of breast cancer, talk with your health care provider about genetic screening.  If you are at high risk for breast cancer, talk with your health care provider about having an MRI and a mammogram every year.  Breast cancer (BRCA) gene test is recommended for women who have family members with BRCA-related cancers. Results of the assessment will determine the need for genetic counseling and BRCA1 and for BRCA2 testing. BRCA-related cancers include these types: ? Breast. This occurs in males or females. ? Ovarian. ? Tubal. This may also be called fallopian tube cancer. ? Cancer of the abdominal or pelvic lining (peritoneal cancer). ? Prostate. ? Pancreatic.  Cervical, Uterine, and Ovarian Cancer Your health care provider may recommend that you be screened regularly for cancer of the pelvic organs. These include your ovaries, uterus, and vagina. This screening involves a pelvic exam, which includes checking for microscopic changes to the surface of your cervix (Pap test).  For women ages 21-65, health care providers may recommend a pelvic exam and a Pap test every three years. For women ages 20-65, they may recommend the Pap test and pelvic exam, combined with testing for human papilloma virus (HPV), every five years. Some types of HPV increase your risk of cervical  cancer. Testing for HPV may also be done on women of any age who have unclear Pap test results.  Other health care providers may not recommend any screening for nonpregnant women who are considered low risk for pelvic cancer and have no symptoms. Ask your health care provider if a screening pelvic exam is right for you.  If you have had past treatment for cervical cancer or a condition that could lead to cancer, you need Pap tests and screening for cancer for at least 20 years after your treatment. If Pap tests have been discontinued for you, your risk factors (such as having a new sexual partner) need to be reassessed to determine if you should start having screenings again. Some women have medical problems that increase the chance of getting cervical cancer. In these cases, your health care provider may recommend that you have screening and Pap tests more often.  If you have a family history of uterine cancer or ovarian cancer, talk with your health care provider about genetic screening.  If you have vaginal bleeding after reaching menopause, tell your health care provider.  There are currently no reliable tests available to screen for ovarian cancer.  Lung Cancer Lung cancer screening is recommended for adults 37-62 years old who are at high risk for lung cancer because  of a history of smoking. A yearly low-dose CT scan of the lungs is recommended if you:  Currently smoke.  Have a history of at least 30 pack-years of smoking and you currently smoke or have quit within the past 15 years. A pack-year is smoking an average of one pack of cigarettes per day for one year.  Yearly screening should:  Continue until it has been 15 years since you quit.  Stop if you develop a health problem that would prevent you from having lung cancer treatment.  Colorectal Cancer  This type of cancer can be detected and can often be prevented.  Routine colorectal cancer screening usually begins at age 50  and continues through age 75.  If you have risk factors for colon cancer, your health care provider may recommend that you be screened at an earlier age.  If you have a family history of colorectal cancer, talk with your health care provider about genetic screening.  Your health care provider may also recommend using home test kits to check for hidden blood in your stool.  A small camera at the end of a tube can be used to examine your colon directly (sigmoidoscopy or colonoscopy). This is done to check for the earliest forms of colorectal cancer.  Direct examination of the colon should be repeated every 5-10 years until age 75. However, if early forms of precancerous polyps or small growths are found or if you have a family history or genetic risk for colorectal cancer, you may need to be screened more often.  Skin Cancer  Check your skin from head to toe regularly.  Monitor any moles. Be sure to tell your health care provider: ? About any new moles or changes in moles, especially if there is a change in a mole's shape or color. ? If you have a mole that is larger than the size of a pencil eraser.  If any of your family members has a history of skin cancer, especially at a young age, talk with your health care provider about genetic screening.  Always use sunscreen. Apply sunscreen liberally and repeatedly throughout the day.  Whenever you are outside, protect yourself by wearing long sleeves, pants, a wide-brimmed hat, and sunglasses.  What should I know about osteoporosis? Osteoporosis is a condition in which bone destruction happens more quickly than new bone creation. After menopause, you may be at an increased risk for osteoporosis. To help prevent osteoporosis or the bone fractures that can happen because of osteoporosis, the following is recommended:  If you are 19-50 years old, get at least 1,000 mg of calcium and at least 600 mg of vitamin D per day.  If you are older than  age 50 but younger than age 70, get at least 1,200 mg of calcium and at least 600 mg of vitamin D per day.  If you are older than age 70, get at least 1,200 mg of calcium and at least 800 mg of vitamin D per day.  Smoking and excessive alcohol intake increase the risk of osteoporosis. Eat foods that are rich in calcium and vitamin D, and do weight-bearing exercises several times each week as directed by your health care provider. What should I know about how menopause affects my mental health? Depression may occur at any age, but it is more common as you become older. Common symptoms of depression include:  Low or sad mood.  Changes in sleep patterns.  Changes in appetite or eating patterns.  Feeling   an overall lack of motivation or enjoyment of activities that you previously enjoyed.  Frequent crying spells.  Talk with your health care provider if you think that you are experiencing depression. What should I know about immunizations? It is important that you get and maintain your immunizations. These include:  Tetanus, diphtheria, and pertussis (Tdap) booster vaccine.  Influenza every year before the flu season begins.  Pneumonia vaccine.  Shingles vaccine.  Your health care provider may also recommend other immunizations. This information is not intended to replace advice given to you by your health care provider. Make sure you discuss any questions you have with your health care provider. Document Released: 11/06/2005 Document Revised: 04/03/2016 Document Reviewed: 06/18/2015 Elsevier Interactive Patient Education  2018 [employer]  

## 2018-08-12 ENCOUNTER — Other Ambulatory Visit: Payer: 59

## 2018-08-13 LAB — CBC
HEMATOCRIT: 44.6 % (ref 34.0–46.6)
HEMOGLOBIN: 14.5 g/dL (ref 11.1–15.9)
MCH: 26.3 pg — ABNORMAL LOW (ref 26.6–33.0)
MCHC: 32.5 g/dL (ref 31.5–35.7)
MCV: 81 fL (ref 79–97)
PLATELETS: 241 10*3/uL (ref 150–450)
RBC: 5.52 x10E6/uL — AB (ref 3.77–5.28)
RDW: 13.5 % (ref 12.3–15.4)
WBC: 5.2 10*3/uL (ref 3.4–10.8)

## 2018-08-13 LAB — TSH: TSH: 1.55 u[IU]/mL (ref 0.450–4.500)

## 2018-09-05 ENCOUNTER — Other Ambulatory Visit: Payer: Self-pay | Admitting: Obstetrics and Gynecology

## 2018-09-05 DIAGNOSIS — N6489 Other specified disorders of breast: Secondary | ICD-10-CM

## 2018-09-19 ENCOUNTER — Ambulatory Visit
Admission: RE | Admit: 2018-09-19 | Discharge: 2018-09-19 | Disposition: A | Discharge status: Home / Self Care | Payer: 59 | Source: Ambulatory Visit | Attending: Obstetrics and Gynecology | Admitting: Obstetrics and Gynecology

## 2018-09-19 DIAGNOSIS — N6489 Other specified disorders of breast: Secondary | ICD-10-CM

## 2019-01-23 ENCOUNTER — Telehealth: Payer: Self-pay | Admitting: Obstetrics and Gynecology

## 2019-01-23 NOTE — Telephone Encounter (Signed)
Patient called stating she is having pain under the right breast and armpit. She didn't know if she should be concerned. Please Advise.

## 2019-01-25 NOTE — Telephone Encounter (Signed)
Pt was called and she stated that she noticed over the weekend that her feet was swollen and then she started feeling  pain in the left area of her chest and pain in her arm. Pt stated that she think it is stress due to her currently finding out that her job maybe reducing her hours. Pt stated that she has been feeling a little better and has not noticed the pain as much. Pt was asked several times if she wanted to come in to see [facility] for a check up. Pt stated that she would just wait and see and would take Aspirin 81 mg. Pt was highly advised that if the pain increased and she feels pain in her left upper arm or neck that she needed to go the ED. Pt stated that she understood. Pt was also advised to call the office to make an appointment with [facility] if she continues to feel the pain after taking the Aspirin 81 mg for 2 weeks.

## 2019-08-14 ENCOUNTER — Encounter: Payer: 59 | Admitting: Obstetrics and Gynecology

## 2019-08-16 ENCOUNTER — Telehealth: Payer: Self-pay

## 2019-08-16 ENCOUNTER — Ambulatory Visit (INDEPENDENT_AMBULATORY_CARE_PROVIDER_SITE_OTHER): Payer: 59 | Admitting: Obstetrics and Gynecology

## 2019-08-16 ENCOUNTER — Other Ambulatory Visit: Payer: Self-pay

## 2019-08-16 ENCOUNTER — Other Ambulatory Visit: Payer: Self-pay | Admitting: Obstetrics and Gynecology

## 2019-08-16 ENCOUNTER — Encounter: Payer: Self-pay | Admitting: Obstetrics and Gynecology

## 2019-08-16 VITALS — BP 158/82 | HR 88 | Wt 181.9 lb

## 2019-08-16 DIAGNOSIS — Z01419 Encounter for gynecological examination (general) (routine) without abnormal findings: Secondary | ICD-10-CM | POA: Diagnosis not present

## 2019-08-16 DIAGNOSIS — Z1231 Encounter for screening mammogram for malignant neoplasm of breast: Secondary | ICD-10-CM | POA: Diagnosis not present

## 2019-08-16 DIAGNOSIS — R928 Other abnormal and inconclusive findings on diagnostic imaging of breast: Secondary | ICD-10-CM

## 2019-08-16 DIAGNOSIS — N951 Menopausal and female climacteric states: Secondary | ICD-10-CM

## 2019-08-16 DIAGNOSIS — N6320 Unspecified lump in the left breast, unspecified quadrant: Secondary | ICD-10-CM

## 2019-08-16 NOTE — Telephone Encounter (Signed)
Pt states she went to [facility] to schedule mammogram & they informed pt they cannot schedule mammogram until provider sends signed order or forms signed by provider. Pt will check back with [facility] this afternoon to schedule if they have received info from provider.

## 2019-08-16 NOTE — Telephone Encounter (Addendum)
Noted. Order was placed at the visit. Dr. Amalia Hailey has to sign off on the orders once they are placed. Patient is not due for mammogram until 08/2019.

## 2019-08-16 NOTE — Progress Notes (Signed)
HPI:      [name] is a 53 y.o. G1P1001 who LMP was No LMP recorded. Patient has had a hysterectomy.  Subjective:   She presents today for her annual examination.  She has no complaints.  She has been having some night sweats and occasional daily hot flashes but she does not find them disabling.  She would like to remain healthy has some concerns regarding future heart disease.    Hx: The following portions of the patient's history were reviewed and updated as appropriate:             She  has a past medical history of Breast lump in female, Elevated BP, Endometriosis of intestine, Fibroid, Night sweats, Perimenopausal, Vaginal Pap smear, abnormal, and Wears contact lenses. She does not have any pertinent problems on file. She  has a past surgical history that includes Breast surgery (Right); Abdominal hysterectomy (2008); Colonoscopy with propofol (N/A, 08/30/2015); polypectomy (08/30/2015); Oophorectomy; and Breast excisional biopsy (Right). Her family history includes Breast cancer in her sister. She  reports that she has never smoked. She has never used smokeless tobacco. She reports current alcohol use. She reports that she does not use drugs. She has a current medication list which includes the following prescription(s): aspirin, cholecalciferol, collagen, and multivitamin. She has No Known Allergies.       Review of Systems:  Review of Systems  Constitutional: Denied constitutional symptoms, night sweats, recent illness, fatigue, fever, insomnia and weight loss.  Eyes: Denied eye symptoms, eye pain, photophobia, vision change and visual disturbance.  Ears/Nose/Throat/Neck: Denied ear, nose, throat or neck symptoms, hearing loss, nasal discharge, sinus congestion and sore throat.  Cardiovascular: Denied cardiovascular symptoms, arrhythmia, chest pain/pressure, edema, exercise intolerance, orthopnea and palpitations.  Respiratory: Denied pulmonary symptoms, asthma, pleuritic  pain, productive sputum, cough, dyspnea and wheezing.  Gastrointestinal: Denied, gastro-esophageal reflux, melena, nausea and vomiting.  Genitourinary: Denied genitourinary symptoms including symptomatic vaginal discharge, pelvic relaxation issues, and urinary complaints.  Musculoskeletal: Denied musculoskeletal symptoms, stiffness, swelling, muscle weakness and myalgia.  Dermatologic: Denied dermatology symptoms, rash and scar.  Neurologic: Denied neurology symptoms, dizziness, headache, neck pain and syncope.  Psychiatric: Denied psychiatric symptoms, anxiety and depression.  Endocrine: Denied endocrine symptoms including hot flashes and night sweats.   Meds:   Current Outpatient Medications on File Prior to Visit  Medication Sig Dispense Refill  . aspirin 81 MG chewable tablet Chew by mouth daily.    . cholecalciferol (VITAMIN D) 1000 UNITS tablet Take 1,000 Units by mouth daily.    . Collagen 500 MG CAPS Take by mouth.    . Multiple Vitamin (MULTIVITAMIN) tablet Take 1 tablet by mouth daily.     No current facility-administered medications on file prior to visit.     Objective:     Vitals:   08/16/19 0811  BP: (!) 158/82  Pulse: 88              Physical examination General NAD, Conversant  HEENT Atraumatic; Op clear with mmm.  Normo-cephalic. Pupils reactive. Anicteric sclerae  Thyroid/Neck Smooth without nodularity or enlargement. Normal ROM.  Neck Supple.  Skin No rashes, lesions or ulceration. Normal palpated skin turgor. No nodularity.  Breasts: No masses or discharge.  Symmetric.  No axillary adenopathy.  Lungs: Clear to auscultation.No rales or wheezes. Normal Respiratory effort, no retractions.  Heart: NSR.  No murmurs or rubs appreciated. No periferal edema  Abdomen: Soft.  Non-tender.  No masses.  No HSM. No hernia  Extremities: Moves all appropriately.  Normal ROM for age. No lymphadenopathy.  Neuro: Oriented to PPT.  Normal mood. Normal affect.     Pelvic:    Vulva: Normal appearance.  No lesions.  Vagina: No lesions or abnormalities noted.  Support: Normal pelvic support.  Urethra No masses tenderness or scarring.  Meatus Normal size without lesions or prolapse.  Cervix: Normal appearance.  No lesions.  Anus: Normal exam.  No lesions.  Perineum: Normal exam.  No lesions.        Bimanual   Uterus:  Uterus surgically absent-cervix present.  Adnexae: No masses.  Non-tender to palpation.  Cul-de-sac: Negative for abnormality.      Assessment:    G1P1001 Patient Active Problem List   Diagnosis Date Noted  . Fatigue 08/11/2018  . BV (bacterial vaginosis) 12/22/2016  . Special screening for malignant neoplasms, colon   . Benign neoplasm of ascending colon   . Benign neoplasm of transverse colon   . Benign neoplasm of sigmoid colon   . Elevated blood pressure 07/31/2015  . History of hysterectomy, supracervical 07/31/2015     1. Well woman exam   2. Encounter for screening mammogram for malignant neoplasm of breast   3. Symptomatic menopausal or female climacteric states     Patient considering ERT would like to discuss this further.   Plan:            1.  Basic Screening Recommendations The basic screening recommendations for asymptomatic women were discussed with the patient during her visit.  The age-appropriate recommendations were discussed with her and the rational for the tests reviewed.  When I am informed by the patient that another primary care physician has previously obtained the age-appropriate tests and they are up-to-date, only outstanding tests are ordered and referrals given as necessary.  Abnormal results of tests will be discussed with her when all of her results are completed.  Routine preventative health maintenance measures emphasized: Exercise/Diet/Weight control, Tobacco Warnings, Alcohol/Substance use risks and Stress Management Mammogram ordered-Pap next year.  Lab work today. Orders Orders Placed This  Encounter  Procedures  . MM 3D SCREEN BREAST BILATERAL- standard of care screening mammo 3d  . Lipid Profile  . HgB A1c  . TSH    No orders of the defined types were placed in this encounter.       F/U  Return in about 1 year (around 08/15/2020) for Annual Physical.  Finis Bud, M.D. 08/16/2019 8:41 AM

## 2019-08-16 NOTE — Progress Notes (Signed)
Patient comes in today for yearly physical. No concerns today.

## 2019-08-17 LAB — TSH: TSH: 1.44 u[IU]/mL (ref 0.450–4.500)

## 2019-08-17 LAB — HEMOGLOBIN A1C
Est. average glucose Bld gHb Est-mCnc: 103 mg/dL
Hgb A1c MFr Bld: 5.2 % (ref 4.8–5.6)

## 2019-08-17 LAB — LIPID PANEL
Chol/HDL Ratio: 2.1 ratio (ref 0.0–4.4)
Cholesterol, Total: 160 mg/dL (ref 100–199)
HDL: 75 mg/dL (ref >39)
LDL Chol Calc (NIH): 74 mg/dL (ref 0–99)
Triglycerides: 51 mg/dL (ref 0–149)
VLDL Cholesterol Cal: 11 mg/dL (ref 5–40)

## 2019-08-29 ENCOUNTER — Encounter: Payer: Self-pay | Admitting: Obstetrics and Gynecology

## 2019-08-29 ENCOUNTER — Ambulatory Visit: Payer: 59 | Admitting: Obstetrics and Gynecology

## 2019-08-29 ENCOUNTER — Other Ambulatory Visit: Payer: Self-pay

## 2019-08-29 VITALS — BP 146/88 | HR 88 | Wt 181.5 lb

## 2019-08-29 DIAGNOSIS — N951 Menopausal and female climacteric states: Secondary | ICD-10-CM | POA: Diagnosis not present

## 2019-08-29 DIAGNOSIS — Z79890 Hormone replacement therapy (postmenopausal): Secondary | ICD-10-CM | POA: Diagnosis not present

## 2019-08-29 MED ORDER — ESTRADIOL 1 MG PO TABS: 1.0000 mg | ORAL_TABLET | Freq: Every day | ORAL | 1 refills | Status: DC

## 2019-08-29 NOTE — Progress Notes (Signed)
HPI:      [name] is a 53 y.o. G1P1001 who LMP was No LMP recorded. Patient has had a hysterectomy.  Subjective:   She presents today to discuss hormone replacement therapy.  Patient has hot flashes and occasional night sweats.  At her annual exam visit we began the discussion regarding ERT. Of significant note patient has had a hysterectomy but retains her cervix.    Hx: The following portions of the patient's history were reviewed and updated as appropriate:             She  has a past medical history of Breast lump in female, Elevated BP, Endometriosis of intestine, Fibroid, Night sweats, Perimenopausal, Vaginal Pap smear, abnormal, and Wears contact lenses. She does not have any pertinent problems on file. She  has a past surgical history that includes Breast surgery (Right); Abdominal hysterectomy (2008); Colonoscopy with propofol (N/A, 08/30/2015); polypectomy (08/30/2015); Oophorectomy; and Breast excisional biopsy (Right). Her family history includes Breast cancer in her sister. She  reports that she has never smoked. She has never used smokeless tobacco. She reports current alcohol use. She reports that she does not use drugs. She has a current medication list which includes the following prescription(s): aspirin, cholecalciferol, collagen, estradiol, and multivitamin. She has No Known Allergies.       Review of Systems:  Review of Systems  Constitutional: Denied constitutional symptoms, night sweats, recent illness, fatigue, fever, insomnia and weight loss.  Eyes: Denied eye symptoms, eye pain, photophobia, vision change and visual disturbance.  Ears/Nose/Throat/Neck: Denied ear, nose, throat or neck symptoms, hearing loss, nasal discharge, sinus congestion and sore throat.  Cardiovascular: Denied cardiovascular symptoms, arrhythmia, chest pain/pressure, edema, exercise intolerance, orthopnea and palpitations.  Respiratory: Denied pulmonary symptoms, asthma, pleuritic  pain, productive sputum, cough, dyspnea and wheezing.  Gastrointestinal: Denied, gastro-esophageal reflux, melena, nausea and vomiting.  Genitourinary: Denied genitourinary symptoms including symptomatic vaginal discharge, pelvic relaxation issues, and urinary complaints.  Musculoskeletal: Denied musculoskeletal symptoms, stiffness, swelling, muscle weakness and myalgia.  Dermatologic: Denied dermatology symptoms, rash and scar.  Neurologic: Denied neurology symptoms, dizziness, headache, neck pain and syncope.  Psychiatric: Denied psychiatric symptoms, anxiety and depression.  Endocrine:  Occasional hot flashes and night sweats.   Meds:   Current Outpatient Medications on File Prior to Visit  Medication Sig Dispense Refill  . aspirin 81 MG chewable tablet Chew by mouth daily.    . cholecalciferol (VITAMIN D) 1000 UNITS tablet Take 1,000 Units by mouth daily.    . Collagen 500 MG CAPS Take by mouth.    . Multiple Vitamin (MULTIVITAMIN) tablet Take 1 tablet by mouth daily.     No current facility-administered medications on file prior to visit.     Objective:     Vitals:   08/29/19 1634  BP: (!) 146/88  Pulse: 88                Assessment:    G1P1001 Patient Active Problem List   Diagnosis Date Noted  . Fatigue 08/11/2018  . BV (bacterial vaginosis) 12/22/2016  . Special screening for malignant neoplasms, colon   . Benign neoplasm of ascending colon   . Benign neoplasm of transverse colon   . Benign neoplasm of sigmoid colon   . Elevated blood pressure 07/31/2015  . History of hysterectomy, supracervical 07/31/2015     1. Symptomatic menopausal or female climacteric states   2. Hormone replacement therapy (HRT)     Patient in menopause and is having  symptoms.  She would also like to consider the long-term health benefits of ERT.   Plan:            1.  HRT I have discussed HRT with the patient in detail.  The risk/benefits of it were reviewed.  She understands  that during menopause Estrogen decreases dramatically and that this results in an increased risk of cardiovascular disease as well as osteoporosis.  We have also discussed the fact that hot flashes often result from a decrease in Estrogen, and that by replacing Estrogen, they can often be alleviated.  We have discussed skin, vaginal and urinary tract changes that may also take place from this drop in Estrogen.  Emotional changes have also been linked to Estrogen and we have briefly discussed this.  The benefits of HRT including decrease in hot flashes, vaginal dryness, and osteoporosis were discussed.  The emotional benefit and a possible change in her cardiovascular risk profile was also reviewed.  The risks associated with Hormone Replacement Therapy were also reviewed.  The use of unopposed Estrogen and its relationship to endometrial cancer was discussed.  The addition of Progesterone and its beneficial effect on endometrial cancer was also noted.  The fact that there has been no consistent definitive studies showing an increase in breast cancer in women who use HRT was discussed with the patient.  The possible side effects including breast tenderness, fluid retention, mood changes and vaginal bleeding were discussed.  The patient was informed that this is an elective medication and that she may choose not to take Hormone Replacement Therapy.  Literature on HRT was given, and I believe that after answering all of the patient's questions, she has an adequate and informed understanding of HRT.  Special emphasis on the WHI study, as well as several studies since that pertaining to the risks and benefits of estrogen replacement therapy were compared.  The possible limitations of these studies were discussed including the age stratification of the WHI study.  The possible role of Progesterone in these studies was discussed in detail.  I believe that the patient has an informed knowledge of the risks and benefits of  HRT.  I have specifically discussed WHI findings and current updates.  Different type of hormone formulation and methods of taking hormone replacement therapy discussed.  We will begin Estrace 1 mg daily Orders No orders of the defined types were placed in this encounter.    Meds ordered this encounter  Medications  . estradiol (ESTRACE) 1 MG tablet    Sig: Take 1 tablet (1 mg total) by mouth daily.    Dispense:  90 tablet    Refill:  1      F/U  Return in about 3 months (around 11/27/2019). I spent 27 minutes involved in the care of this patient of which greater than 50% was spent discussing risks and benefits of HRT/ERT as described above.  All questions answered.  Finis Bud, M.D. 08/29/2019 6:26 PM

## 2019-09-26 ENCOUNTER — Ambulatory Visit
Admission: RE | Admit: 2019-09-26 | Discharge: 2019-09-26 | Disposition: A | Discharge status: Home / Self Care | Payer: 59 | Source: Ambulatory Visit | Attending: Obstetrics and Gynecology | Admitting: Obstetrics and Gynecology

## 2019-09-26 DIAGNOSIS — R928 Other abnormal and inconclusive findings on diagnostic imaging of breast: Secondary | ICD-10-CM | POA: Insufficient documentation

## 2019-09-26 DIAGNOSIS — N6320 Unspecified lump in the left breast, unspecified quadrant: Secondary | ICD-10-CM

## 2019-09-26 DIAGNOSIS — Z1231 Encounter for screening mammogram for malignant neoplasm of breast: Secondary | ICD-10-CM

## 2019-10-11 ENCOUNTER — Telehealth: Payer: Self-pay | Admitting: Obstetrics and Gynecology

## 2019-10-11 NOTE — Telephone Encounter (Signed)
Pt called in and said that she doesn't have a pcp and she didn't know if doc evans could send a referral. For her to [facility]? Pt is requesting a call back. Please advise

## 2019-10-16 NOTE — Telephone Encounter (Signed)
Spoke with patient and she is having some knee pain. I told her that she would need to be seen. I told her to go to [facility] walkin to be seen. If she needs to be seen by Ortho they will send her there. Patient verbalized understanding and thanked me for my help.

## 2019-11-26 ENCOUNTER — Ambulatory Visit: Payer: 59 | Attending: Obstetrics and Gynecology

## 2019-11-26 DIAGNOSIS — Z23 Encounter for immunization: Secondary | ICD-10-CM | POA: Insufficient documentation

## 2019-11-26 NOTE — Progress Notes (Signed)
   Covid-19 Vaccination Clinic  Name:  Tara [name]    MRN: [id number] DOB: June 02, 1966  11/26/2019  Tara [name] was observed post Covid-19 immunization for 15 minutes without incidence. She was provided with Vaccine Information Sheet and instruction to access the V-Safe system.   Tara [name] was instructed to call 911 with any severe reactions post vaccine: Marland Kitchen Difficulty breathing  . Swelling of your face and throat  . A fast heartbeat  . A bad rash all over your body  . Dizziness and weakness    Immunizations Administered    Name Date Dose VIS Date Route   Pfizer COVID-19 Vaccine 11/26/2019  8:36 AM 0.3 mL 09/08/2019 Intramuscular   Manufacturer: [place]   Lot: [id number]   [place]: [id number]

## 2019-11-28 ENCOUNTER — Encounter: Payer: 59 | Admitting: Obstetrics and Gynecology

## 2019-11-28 ENCOUNTER — Encounter: Payer: Self-pay | Admitting: Obstetrics and Gynecology

## 2019-11-28 ENCOUNTER — Ambulatory Visit: Payer: 59 | Admitting: Obstetrics and Gynecology

## 2019-11-28 ENCOUNTER — Other Ambulatory Visit: Payer: Self-pay

## 2019-11-28 VITALS — BP 165/94 | HR 69 | Ht 67.0 in | Wt 184.4 lb

## 2019-11-28 DIAGNOSIS — Z79890 Hormone replacement therapy (postmenopausal): Secondary | ICD-10-CM | POA: Diagnosis not present

## 2019-11-28 DIAGNOSIS — N951 Menopausal and female climacteric states: Secondary | ICD-10-CM

## 2019-11-28 MED ORDER — ESTRADIOL 1 MG PO TABS: 1.0000 mg | ORAL_TABLET | Freq: Every day | ORAL | 2 refills | Status: DC

## 2019-11-28 NOTE — Progress Notes (Signed)
Patient here to follow-up after starting HRT.  Patient states "I guess it is helping".

## 2019-11-28 NOTE — Progress Notes (Signed)
HPI:      Tara [name] is a 54 y.o. G1P1001 who LMP was No LMP recorded. Patient has had a hysterectomy.  Subjective:   She presents today for follow-up of ERT.  Patient mainly taking ERT for prophylactic purposes to prevent osteoporosis vaginal issues etc.  She had occasional night sweats but these have now resolved with estrogen. Of significant note patient has a hysterectomy but has retained her cervix.  She reports no bleeding on ERT.    Hx: The following portions of the patient's history were reviewed and updated as appropriate:             She  has a past medical history of Breast lump in female, Elevated BP, Endometriosis of intestine, Fibroid, Night sweats, Perimenopausal, Vaginal Pap smear, abnormal, and Wears contact lenses. She does not have any pertinent problems on file. She  has a past surgical history that includes Breast surgery (Right); Abdominal hysterectomy (2008); Colonoscopy with propofol (N/A, 08/30/2015); polypectomy (08/30/2015); Oophorectomy; and Breast excisional biopsy (Right). Her family history includes Breast cancer in her sister. She  reports that she has never smoked. She has never used smokeless tobacco. She reports previous alcohol use. She reports that she does not use drugs. She has a current medication list which includes the following prescription(s): aspirin, cholecalciferol, collagen, estradiol, multivitamin, and estradiol. She has No Known Allergies.       Review of Systems:  Review of Systems  Constitutional: Denied constitutional symptoms, night sweats, recent illness, fatigue, fever, insomnia and weight loss.  Eyes: Denied eye symptoms, eye pain, photophobia, vision change and visual disturbance.  Ears/Nose/Throat/Neck: Denied ear, nose, throat or neck symptoms, hearing loss, nasal discharge, sinus congestion and sore throat.  Cardiovascular: Denied cardiovascular symptoms, arrhythmia, chest pain/pressure, edema, exercise intolerance, orthopnea  and palpitations.  Respiratory: Denied pulmonary symptoms, asthma, pleuritic pain, productive sputum, cough, dyspnea and wheezing.  Gastrointestinal: Denied, gastro-esophageal reflux, melena, nausea and vomiting.  Genitourinary: Denied genitourinary symptoms including symptomatic vaginal discharge, pelvic relaxation issues, and urinary complaints.  Musculoskeletal: Denied musculoskeletal symptoms, stiffness, swelling, muscle weakness and myalgia.  Dermatologic: Denied dermatology symptoms, rash and scar.  Neurologic: Denied neurology symptoms, dizziness, headache, neck pain and syncope.  Psychiatric: Denied psychiatric symptoms, anxiety and depression.  Endocrine: Denied endocrine symptoms including hot flashes and night sweats.   Meds:   Current Outpatient Medications on File Prior to Visit  Medication Sig Dispense Refill  . aspirin 81 MG chewable tablet Chew by mouth daily.    . cholecalciferol (VITAMIN D) 1000 UNITS tablet Take 1,000 Units by mouth daily.    . Collagen 500 MG CAPS Take by mouth.    . estradiol (ESTRACE) 1 MG tablet Take 1 tablet (1 mg total) by mouth daily. 90 tablet 1  . Multiple Vitamin (MULTIVITAMIN) tablet Take 1 tablet by mouth daily.     No current facility-administered medications on file prior to visit.    Objective:     Vitals:   11/28/19 1419  BP: (!) 165/94  Pulse: 69                Assessment:    G1P1001 Patient Active Problem List   Diagnosis Date Noted  . Fatigue 08/11/2018  . BV (bacterial vaginosis) 12/22/2016  . Special screening for malignant neoplasms, colon   . Benign neoplasm of ascending colon   . Benign neoplasm of transverse colon   . Benign neoplasm of sigmoid colon   . Elevated blood pressure 07/31/2015  .  History of hysterectomy, supracervical 07/31/2015     1. Postmenopausal hormone therapy   2. Symptomatic menopausal or female climacteric states     Patient doing well on ERT.  No complaints-no issues.   Plan:             1.  Continue ERT. Orders No orders of the defined types were placed in this encounter.    Meds ordered this encounter  Medications  . estradiol (ESTRACE) 1 MG tablet    Sig: Take 1 tablet (1 mg total) by mouth daily.    Dispense:  90 tablet    Refill:  2      F/U  Return in about 10 months (around 09/29/2020) for Annual Physical. I spent 22 minutes involved in the care of this patient preparing to see the patient by obtaining and reviewing her medical history (including labs, imaging tests and prior procedures), documenting clinical information in the electronic health record (EHR), counseling and coordinating care plans, writing and sending prescriptions, ordering tests or procedures and directly communicating with the patient by discussing pertinent items from her history and physical exam as well as detailing my assessment and plan as noted above so that she has an informed understanding.  All of her questions were answered.  Tara [name], M.D. 11/28/2019 2:48 PM

## 2019-12-18 ENCOUNTER — Ambulatory Visit: Payer: 59 | Attending: Internal Medicine

## 2019-12-18 DIAGNOSIS — Z23 Encounter for immunization: Secondary | ICD-10-CM

## 2019-12-18 NOTE — Progress Notes (Signed)
   Covid-19 Vaccination Clinic  Name:  TRANISE TUAZON    MRN: [id number] DOB: [birth year]  12/18/2019  Ms. Penado was observed post Covid-19 immunization for 15 minutes without incident. She was provided with Vaccine Information Sheet and instruction to access the V-Safe system.   Ms. Writt was instructed to call 911 with any severe reactions post vaccine: Marland Kitchen Difficulty breathing  . Swelling of face and throat  . A fast heartbeat  . A bad rash all over body  . Dizziness and weakness   Immunizations Administered    Name Date Dose VIS Date Route   Pfizer COVID-19 Vaccine 12/18/2019  8:26 AM 0.3 mL 09/08/2019 Intramuscular   Manufacturer: [place]   Lot: [id number]   [place]: [id number]

## 2020-07-16 ENCOUNTER — Ambulatory Visit: Payer: 59 | Attending: Internal Medicine

## 2020-07-16 DIAGNOSIS — Z23 Encounter for immunization: Secondary | ICD-10-CM

## 2020-07-16 NOTE — Progress Notes (Signed)
° °  Covid-19 Vaccination Clinic  Name:  Tara [name]    MRN: [id number] DOB: 01-19-1966  07/16/2020  Tara [name] was observed post Covid-19 immunization for 15 minutes without incident. She was provided with Vaccine Information Sheet and instruction to access the V-Safe system.   Tara [name] was instructed to call 911 with any severe reactions post vaccine:  Difficulty breathing   Swelling of face and throat   A fast heartbeat   A bad rash all over body   Dizziness and weakness

## 2020-07-17 ENCOUNTER — Other Ambulatory Visit: Payer: Self-pay | Admitting: Internal Medicine

## 2020-08-20 ENCOUNTER — Other Ambulatory Visit (HOSPITAL_COMMUNITY)
Admission: RE | Admit: 2020-08-20 | Discharge: 2020-08-20 | Disposition: A | Discharge status: Home / Self Care | Payer: 59 | Source: Ambulatory Visit | Attending: Obstetrics and Gynecology | Admitting: Obstetrics and Gynecology

## 2020-08-20 ENCOUNTER — Encounter: Payer: Self-pay | Admitting: Obstetrics and Gynecology

## 2020-08-20 ENCOUNTER — Ambulatory Visit (INDEPENDENT_AMBULATORY_CARE_PROVIDER_SITE_OTHER): Payer: 59 | Admitting: Obstetrics and Gynecology

## 2020-08-20 ENCOUNTER — Other Ambulatory Visit: Payer: Self-pay

## 2020-08-20 ENCOUNTER — Telehealth: Payer: Self-pay

## 2020-08-20 ENCOUNTER — Other Ambulatory Visit: Payer: Self-pay | Admitting: Obstetrics and Gynecology

## 2020-08-20 VITALS — BP 153/103 | HR 85 | Ht 68.0 in | Wt 184.9 lb

## 2020-08-20 DIAGNOSIS — Z124 Encounter for screening for malignant neoplasm of cervix: Secondary | ICD-10-CM | POA: Insufficient documentation

## 2020-08-20 DIAGNOSIS — Z1231 Encounter for screening mammogram for malignant neoplasm of breast: Secondary | ICD-10-CM

## 2020-08-20 DIAGNOSIS — Z79890 Hormone replacement therapy (postmenopausal): Secondary | ICD-10-CM

## 2020-08-20 DIAGNOSIS — N6320 Unspecified lump in the left breast, unspecified quadrant: Secondary | ICD-10-CM

## 2020-08-20 DIAGNOSIS — N951 Menopausal and female climacteric states: Secondary | ICD-10-CM

## 2020-08-20 DIAGNOSIS — R928 Other abnormal and inconclusive findings on diagnostic imaging of breast: Secondary | ICD-10-CM

## 2020-08-20 DIAGNOSIS — Z01419 Encounter for gynecological examination (general) (routine) without abnormal findings: Secondary | ICD-10-CM | POA: Diagnosis not present

## 2020-08-20 DIAGNOSIS — Z1159 Encounter for screening for other viral diseases: Secondary | ICD-10-CM

## 2020-08-20 MED ORDER — ESTRADIOL 1 MG PO TABS: 1.0000 mg | ORAL_TABLET | Freq: Every day | ORAL | 3 refills | Status: DC

## 2020-08-20 NOTE — Telephone Encounter (Signed)
Patient called in stating that she had some orders placed over at [facility] however they wont allow her to make the appointment as they didn't have the correct orders in. Patient states she needs a left breast ultrasound as well as as a bilateral ultrasound. Could you please advise?

## 2020-08-20 NOTE — Progress Notes (Signed)
HPI:      [name] is a 54 y.o. G1P1001 who LMP was No LMP recorded. Patient has had a hysterectomy.  Subjective:   She presents today for her annual examination.  She has no complaints.  She continues to take her estradiol daily.  She is up-to-date on lab work. She previously had a hysterectomy but has retained her cervix.  She is due for a Pap this year. She is specifically requesting hepatitis C testing. She does state that occasionally while doing jumping jacks she leaks a few drops of urine.  She says that this does not happen very often.    Hx: The following portions of the patient's history were reviewed and updated as appropriate:             She  has a past medical history of Breast lump in female, Elevated BP, Endometriosis of intestine, Fibroid, Night sweats, Perimenopausal, Vaginal Pap smear, abnormal, and Wears contact lenses. She does not have any pertinent problems on file. She  has a past surgical history that includes Breast surgery (Right); Abdominal hysterectomy (2008); Colonoscopy with propofol (N/A, 08/30/2015); polypectomy (08/30/2015); Oophorectomy; and Breast excisional biopsy (Right). Her family history includes Breast cancer in her sister. She  reports that she has never smoked. She has never used smokeless tobacco. She reports previous alcohol use. She reports that she does not use drugs. She has a current medication list which includes the following prescription(s): aspirin, cholecalciferol, collagen, estradiol, and multivitamin. She has No Known Allergies.       Review of Systems:  Review of Systems  Constitutional: Denied constitutional symptoms, night sweats, recent illness, fatigue, fever, insomnia and weight loss.  Eyes: Denied eye symptoms, eye pain, photophobia, vision change and visual disturbance.  Ears/Nose/Throat/Neck: Denied ear, nose, throat or neck symptoms, hearing loss, nasal discharge, sinus congestion and sore throat.  Cardiovascular:  Denied cardiovascular symptoms, arrhythmia, chest pain/pressure, edema, exercise intolerance, orthopnea and palpitations.  Respiratory: Denied pulmonary symptoms, asthma, pleuritic pain, productive sputum, cough, dyspnea and wheezing.  Gastrointestinal: Denied, gastro-esophageal reflux, melena, nausea and vomiting.  Genitourinary: Denied genitourinary symptoms including symptomatic vaginal discharge, pelvic relaxation issues, and urinary complaints.  Musculoskeletal: Denied musculoskeletal symptoms, stiffness, swelling, muscle weakness and myalgia.  Dermatologic: Denied dermatology symptoms, rash and scar.  Neurologic: Denied neurology symptoms, dizziness, headache, neck pain and syncope.  Psychiatric: Denied psychiatric symptoms, anxiety and depression.  Endocrine: Denied endocrine symptoms including hot flashes and night sweats.   Meds:   Current Outpatient Medications on File Prior to Visit  Medication Sig Dispense Refill  . aspirin 81 MG chewable tablet Chew by mouth daily.    . cholecalciferol (VITAMIN D) 1000 UNITS tablet Take 1,000 Units by mouth daily.    . Collagen 500 MG CAPS Take by mouth.    . Multiple Vitamin (MULTIVITAMIN) tablet Take 1 tablet by mouth daily.     No current facility-administered medications on file prior to visit.          Objective:     Vitals:   08/20/20 0809  BP: (!) 153/103  Pulse: 85    Filed Weights   08/20/20 0809  Weight: 184 lb 14.4 oz (83.9 kg)              Physical examination General NAD, Conversant  HEENT Atraumatic; Op clear with mmm.  Normo-cephalic. Pupils reactive. Anicteric sclerae  Thyroid/Neck Smooth without nodularity or enlargement. Normal ROM.  Neck Supple.  Skin No rashes, lesions or ulceration. Normal  palpated skin turgor. No nodularity.  Breasts: No masses or discharge.  Symmetric.  No axillary adenopathy.  Lungs: Clear to auscultation.No rales or wheezes. Normal Respiratory effort, no retractions.  Heart: NSR.  No  murmurs or rubs appreciated. No periferal edema  Abdomen: Soft.  Non-tender.  No masses.  No HSM. No hernia  Extremities: Moves all appropriately.  Normal ROM for age. No lymphadenopathy.  Neuro: Oriented to PPT.  Normal mood. Normal affect.     Pelvic:   Vulva: Normal appearance.  No lesions.  Vagina: No lesions or abnormalities noted.  Support: Normal pelvic support.  Urethra No masses tenderness or scarring.  Meatus Normal size without lesions or prolapse.  Cervix: Normal appearance.  No lesions.  Cervical stenosis  Anus: Normal exam.  No lesions.  Perineum: Normal exam.  No lesions.        Bimanual   Uterus:  Surgically absent  Adnexae: No masses.  Non-tender to palpation.  Cul-de-sac: Negative for abnormality.      Assessment:    G1P1001 Patient Active Problem List   Diagnosis Date Noted  . Fatigue 08/11/2018  . BV (bacterial vaginosis) 12/22/2016  . Special screening for malignant neoplasms, colon   . Benign neoplasm of ascending colon   . Benign neoplasm of transverse colon   . Benign neoplasm of sigmoid colon   . Elevated blood pressure 07/31/2015  . History of hysterectomy, supracervical 07/31/2015     1. Well woman exam   2. Encounter for screening mammogram for malignant neoplasm of breast   3. Symptomatic menopausal or female climacteric states   4. Hormone replacement therapy (HRT)   5. Encounter for hepatitis C screening test for low risk patient        Plan:            1.  Basic Screening Recommendations The basic screening recommendations for asymptomatic women were discussed with the patient during her visit.  The age-appropriate recommendations were discussed with her and the rational for the tests reviewed.  When I am informed by the patient that another primary care physician has previously obtained the age-appropriate tests and they are up-to-date, only outstanding tests are ordered and referrals given as necessary.  Abnormal results of tests  will be discussed with her when all of her results are completed.  Routine preventative health maintenance measures emphasized: Exercise/Diet/Weight control, Tobacco Warnings, Alcohol/Substance use risks and Stress Management Pap performed-mammogram ordered-hepatitis C antibody testing -patient gets annual blood work through her work. 2.  Continue ERT  Orders Orders Placed This Encounter  Procedures  . MM 3D SCREEN BREAST BILATERAL  . Hepatitis C antibody     Meds ordered this encounter  Medications  . estradiol (ESTRACE) 1 MG tablet    Sig: Take 1 tablet (1 mg total) by mouth daily.    Dispense:  90 tablet    Refill:  3          F/U  No follow-ups on file.  Finis Bud, M.D. 08/20/2020 8:37 AM

## 2020-08-20 NOTE — Addendum Note (Signed)
Addended by: Durwin Glaze on: 08/20/2020 09:14 AM   Modules accepted: Orders

## 2020-08-20 NOTE — Telephone Encounter (Signed)
Placed the correct order and let the patient know so she can call to schedule.

## 2020-08-21 LAB — HEPATITIS C ANTIBODY: Hep C Virus Ab: 0.2 s/co ratio (ref 0.0–0.9)

## 2020-08-26 LAB — CYTOLOGY - PAP
Adequacy: ABSENT
Comment: NEGATIVE
Diagnosis: NEGATIVE
High risk HPV: NEGATIVE

## 2020-08-27 NOTE — Telephone Encounter (Signed)
Patient called in stating that she tried to call and set up her appt with [facility] however they are now stating that they sent over some orders for Dr. Amalia Hailey to sign and it wont be until he signs them that she can be seen by them. I informed the patient I would send a message back to Dr. Amalia Hailey nurse and we could go from there.  Could you please advise?

## 2020-09-26 ENCOUNTER — Ambulatory Visit
Admission: RE | Admit: 2020-09-26 | Discharge: 2020-09-26 | Disposition: A | Discharge status: Home / Self Care | Payer: 59 | Source: Ambulatory Visit | Attending: Obstetrics and Gynecology | Admitting: Obstetrics and Gynecology

## 2020-09-26 ENCOUNTER — Other Ambulatory Visit: Payer: Self-pay

## 2020-09-26 DIAGNOSIS — Z1231 Encounter for screening mammogram for malignant neoplasm of breast: Secondary | ICD-10-CM

## 2020-09-26 DIAGNOSIS — N6320 Unspecified lump in the left breast, unspecified quadrant: Secondary | ICD-10-CM | POA: Diagnosis present

## 2020-09-26 DIAGNOSIS — R928 Other abnormal and inconclusive findings on diagnostic imaging of breast: Secondary | ICD-10-CM | POA: Insufficient documentation

## 2020-11-21 ENCOUNTER — Other Ambulatory Visit: Payer: Self-pay

## 2020-11-21 ENCOUNTER — Other Ambulatory Visit (HOSPITAL_COMMUNITY)
Admission: RE | Admit: 2020-11-21 | Discharge: 2020-11-21 | Disposition: A | Discharge status: Home / Self Care | Payer: 59 | Source: Ambulatory Visit | Attending: Obstetrics and Gynecology | Admitting: Obstetrics and Gynecology

## 2020-11-21 ENCOUNTER — Encounter: Payer: Self-pay | Admitting: Obstetrics and Gynecology

## 2020-11-21 ENCOUNTER — Ambulatory Visit: Payer: 59 | Admitting: Obstetrics and Gynecology

## 2020-11-21 VITALS — BP 150/93 | HR 69 | Ht 68.0 in | Wt 189.9 lb

## 2020-11-21 DIAGNOSIS — N898 Other specified noninflammatory disorders of vagina: Secondary | ICD-10-CM | POA: Diagnosis not present

## 2020-11-21 NOTE — Progress Notes (Signed)
Pt present today due to vaginal itching. Pt stated that she took Monistat and noticed a big improvement in the vaginal itching. Pt is concerned now about BV and would like to checked.

## 2020-11-21 NOTE — Addendum Note (Signed)
Addended by: Edwyna Shell on: 11/21/2020 04:10 PM   Modules accepted: Orders

## 2020-11-21 NOTE — Progress Notes (Signed)
HPI:      Tara [name] is a 55 y.o. G1P1001 who LMP was No LMP recorded. Patient has had a hysterectomy.  Subjective:   She presents today stating that over the last few days she had a vaginal discharge with some itching.  She self medicated with antifungals but she is not sure that it is completely gone.  She would like to be tested just to be sure. She states that she has absolutely no concerns regarding the possibility of STDs and does not need testing.    Hx: The following portions of the patient's history were reviewed and updated as appropriate:             She  has a past medical history of Breast lump in female, Elevated BP, Endometriosis of intestine, Fibroid, Night sweats, Perimenopausal, Vaginal Pap smear, abnormal, and Wears contact lenses. She does not have any pertinent problems on file. She  has a past surgical history that includes Breast surgery (Right); Abdominal hysterectomy (2008); Colonoscopy with propofol (N/A, 08/30/2015); polypectomy (08/30/2015); Oophorectomy; and Breast excisional biopsy (Right). Her family history includes Breast cancer in her sister. She  reports that she has never smoked. She has never used smokeless tobacco. She reports current alcohol use. She reports that she does not use drugs. She has a current medication list which includes the following prescription(s): aspirin, cholecalciferol, collagen, estradiol, and multivitamin. She has No Known Allergies.       Review of Systems:  Review of Systems  Constitutional: Denied constitutional symptoms, night sweats, recent illness, fatigue, fever, insomnia and weight loss.  Eyes: Denied eye symptoms, eye pain, photophobia, vision change and visual disturbance.  Ears/Nose/Throat/Neck: Denied ear, nose, throat or neck symptoms, hearing loss, nasal discharge, sinus congestion and sore throat.  Cardiovascular: Denied cardiovascular symptoms, arrhythmia, chest pain/pressure, edema, exercise intolerance,  orthopnea and palpitations.  Respiratory: Denied pulmonary symptoms, asthma, pleuritic pain, productive sputum, cough, dyspnea and wheezing.  Gastrointestinal: Denied, gastro-esophageal reflux, melena, nausea and vomiting.  Genitourinary: Denied genitourinary symptoms including symptomatic vaginal discharge, pelvic relaxation issues, and urinary complaints.  Musculoskeletal: Denied musculoskeletal symptoms, stiffness, swelling, muscle weakness and myalgia.  Dermatologic: Denied dermatology symptoms, rash and scar.  Neurologic: Denied neurology symptoms, dizziness, headache, neck pain and syncope.  Psychiatric: Denied psychiatric symptoms, anxiety and depression.  Endocrine: Denied endocrine symptoms including hot flashes and night sweats.   Meds:   Current Outpatient Medications on File Prior to Visit  Medication Sig Dispense Refill  . aspirin 81 MG chewable tablet Chew by mouth daily.    . cholecalciferol (VITAMIN D) 1000 UNITS tablet Take 1,000 Units by mouth daily.    . Collagen 500 MG CAPS Take by mouth.    . estradiol (ESTRACE) 1 MG tablet Take 1 tablet (1 mg total) by mouth daily. 90 tablet 3  . Multiple Vitamin (MULTIVITAMIN) tablet Take 1 tablet by mouth daily.     No current facility-administered medications on file prior to visit.          Objective:     Vitals:   11/21/20 1540  BP: (!) 150/93  Pulse: 69   Filed Weights   11/21/20 1540  Weight: 189 lb 14.4 oz (86.1 kg)              Physical examination   Pelvic:   Vulva: Normal appearance.  No lesions.  Vagina: No lesions or abnormalities noted.  Support: Normal pelvic support.  Urethra No masses tenderness or scarring.  Meatus Normal size  without lesions or prolapse.  Cervix: Normal appearance.  No lesions.  Anus: Normal exam.  No lesions.  Perineum: Normal exam.  No lesions.        Bimanual   Uterus: Normal size.  Non-tender.  Mobile.  AV.  Adnexae: No masses.  Non-tender to palpation.  Cul-de-sac:  Negative for abnormality.     Assessment:    G1P1001 Patient Active Problem List   Diagnosis Date Noted  . Fatigue 08/11/2018  . BV (bacterial vaginosis) 12/22/2016  . Special screening for malignant neoplasms, colon   . Benign neoplasm of ascending colon   . Benign neoplasm of transverse colon   . Benign neoplasm of sigmoid colon   . Elevated blood pressure 07/31/2015  . History of hysterectomy, supracervical 07/31/2015     1. Vaginal discharge     Small amount of discharge itching has remained after patient self medicated with antifungals.   Plan:            1.  Nuswab performed Orders No orders of the defined types were placed in this encounter.   No orders of the defined types were placed in this encounter.     F/U  Return for Annual Physical, We will contact her with any abnormal test results. I spent 21 minutes involved in the care of this patient preparing to see the patient by obtaining and reviewing her medical history (including labs, imaging tests and prior procedures), documenting clinical information in the electronic health record (EHR), counseling and coordinating care plans, writing and sending prescriptions, ordering tests or procedures and directly communicating with the patient by discussing pertinent items from her history and physical exam as well as detailing my assessment and plan as noted above so that she has an informed understanding.  All of her questions were answered.  Tara [name], M.D. 11/21/2020 4:01 PM

## 2020-11-26 ENCOUNTER — Other Ambulatory Visit: Payer: Self-pay

## 2020-11-26 LAB — CERVICOVAGINAL ANCILLARY ONLY
Bacterial Vaginitis (gardnerella): POSITIVE — AB
Candida Glabrata: NEGATIVE
Candida Vaginitis: NEGATIVE
Comment: NEGATIVE
Comment: NEGATIVE
Comment: NEGATIVE

## 2020-11-26 MED ORDER — METRONIDAZOLE 500 MG PO TABS: 500.0000 mg | ORAL_TABLET | Freq: Two times a day (BID) | ORAL | 0 refills | Status: DC

## 2021-01-03 ENCOUNTER — Telehealth: Payer: Self-pay | Admitting: Obstetrics and Gynecology

## 2021-01-03 NOTE — Telephone Encounter (Signed)
New Message:  Pt called and stated that she was seen by Dr Amalia Hailey on 11/21/20.  She stated that he ordered blood work from the [employer] and she works for [employer] and doesn't have to pay anything when getting her labs done.

## 2021-01-06 NOTE — Telephone Encounter (Signed)
Call transferred from front desk-   Pt is a LCE. She noticed a bill on her my chart for 85.00 for a swab done on 2/24.   Aplogized to the pt for the error. Pt aware  [facility] will [facility] to get this resolved. Asked pt to call be back if she receives a bill after 45 days from today's date. Also placed note on pts chart that all labs go to [facility] lab.

## 2021-07-22 ENCOUNTER — Other Ambulatory Visit: Payer: Self-pay | Admitting: Obstetrics and Gynecology

## 2021-07-22 DIAGNOSIS — Z79890 Hormone replacement therapy (postmenopausal): Secondary | ICD-10-CM

## 2021-07-22 DIAGNOSIS — N951 Menopausal and female climacteric states: Secondary | ICD-10-CM

## 2021-08-27 ENCOUNTER — Other Ambulatory Visit: Payer: Self-pay

## 2021-08-27 ENCOUNTER — Encounter: Payer: Self-pay | Admitting: Obstetrics and Gynecology

## 2021-08-27 ENCOUNTER — Ambulatory Visit (INDEPENDENT_AMBULATORY_CARE_PROVIDER_SITE_OTHER): Payer: 59 | Admitting: Obstetrics and Gynecology

## 2021-08-27 VITALS — BP 124/94 | HR 75 | Ht 68.0 in | Wt 192.2 lb

## 2021-08-27 DIAGNOSIS — Z79890 Hormone replacement therapy (postmenopausal): Secondary | ICD-10-CM | POA: Diagnosis not present

## 2021-08-27 DIAGNOSIS — N951 Menopausal and female climacteric states: Secondary | ICD-10-CM

## 2021-08-27 DIAGNOSIS — Z01419 Encounter for gynecological examination (general) (routine) without abnormal findings: Secondary | ICD-10-CM

## 2021-08-27 MED ORDER — ESTRADIOL 1 MG PO TABS: 1.0000 mg | ORAL_TABLET | Freq: Every day | ORAL | 3 refills | Status: DC

## 2021-08-27 NOTE — Progress Notes (Signed)
Patient presenting for annual physical. Patient reports no concerns at this time.

## 2021-08-27 NOTE — Progress Notes (Signed)
HPI:      [name] is a 55 y.o. G1P1001 who LMP was No LMP recorded. Patient has had a hysterectomy.  Subjective:   She presents today for her annual examination.  She says she is not having any problems other than a small amount of weight gain.  She attributes this to the holidays and eating out for a few weeks in a row. She continues to take Estrace. She has had a hysterectomy but has retained her cervix.    Hx: The following portions of the patient's history were reviewed and updated as appropriate:             She  has a past medical history of Breast lump in female, Elevated BP, Endometriosis of intestine, Fibroid, Night sweats, Perimenopausal, Vaginal Pap smear, abnormal, and Wears contact lenses. She does not have any pertinent problems on file. She  has a past surgical history that includes Breast surgery (Right); Abdominal hysterectomy (2008); Colonoscopy with propofol (N/A, 08/30/2015); polypectomy (08/30/2015); Oophorectomy; and Breast excisional biopsy (Right). Her family history includes Breast cancer in her sister. She  reports that she has never smoked. She has never used smokeless tobacco. She reports current alcohol use. She reports that she does not use drugs. She has a current medication list which includes the following prescription(s): aspirin, bacillus coagulans-inulin, cholecalciferol, collagen, multivitamin, estradiol, and metronidazole. She has No Known Allergies.       Review of Systems:  Review of Systems  Constitutional: Denied constitutional symptoms, night sweats, recent illness, fatigue, fever, insomnia and weight loss.  Eyes: Denied eye symptoms, eye pain, photophobia, vision change and visual disturbance.  Ears/Nose/Throat/Neck: Denied ear, nose, throat or neck symptoms, hearing loss, nasal discharge, sinus congestion and sore throat.  Cardiovascular: Denied cardiovascular symptoms, arrhythmia, chest pain/pressure, edema, exercise intolerance,  orthopnea and palpitations.  Respiratory: Denied pulmonary symptoms, asthma, pleuritic pain, productive sputum, cough, dyspnea and wheezing.  Gastrointestinal: Denied, gastro-esophageal reflux, melena, nausea and vomiting.  Genitourinary: Denied genitourinary symptoms including symptomatic vaginal discharge, pelvic relaxation issues, and urinary complaints.  Musculoskeletal: Denied musculoskeletal symptoms, stiffness, swelling, muscle weakness and myalgia.  Dermatologic: Denied dermatology symptoms, rash and scar.  Neurologic: Denied neurology symptoms, dizziness, headache, neck pain and syncope.  Psychiatric: Denied psychiatric symptoms, anxiety and depression.  Endocrine: Denied endocrine symptoms including hot flashes and night sweats.   Meds:   Current Outpatient Medications on File Prior to Visit  Medication Sig Dispense Refill   aspirin 81 MG chewable tablet Chew by mouth daily.     Bacillus Coagulans-Inulin (ALIGN PREBIOTIC-PROBIOTIC PO) Take by mouth.     cholecalciferol (VITAMIN D) 1000 UNITS tablet Take 1,000 Units by mouth daily.     Collagen 500 MG CAPS Take by mouth.     Multiple Vitamin (MULTIVITAMIN) tablet Take 1 tablet by mouth daily.     metroNIDAZOLE (FLAGYL) 500 MG tablet Take 1 tablet (500 mg total) by mouth 2 (two) times daily. (Patient not taking: Reported on 08/27/2021) 14 tablet 0   No current facility-administered medications on file prior to visit.    Objective:     Vitals:   08/27/21 0819  BP: (!) 124/94  Pulse: 75    Filed Weights   08/27/21 0819  Weight: 192 lb 3.2 oz (87.2 kg)              Physical examination General NAD, Conversant  HEENT Atraumatic; Op clear with mmm.  Normo-cephalic. Pupils reactive. Anicteric sclerae  Thyroid/Neck Smooth without nodularity or  enlargement. Normal ROM.  Neck Supple.  Skin No rashes, lesions or ulceration. Normal palpated skin turgor. No nodularity.  Breasts: No masses or discharge.  Symmetric.  No axillary  adenopathy.  Lungs: Clear to auscultation.No rales or wheezes. Normal Respiratory effort, no retractions.  Heart: NSR.  No murmurs or rubs appreciated. No periferal edema  Abdomen: Soft.  Non-tender.  No masses.  No HSM. No hernia  Extremities: Moves all appropriately.  Normal ROM for age. No lymphadenopathy.  Neuro: Oriented to PPT.  Normal mood. Normal affect.     Pelvic:   Vulva: Normal appearance.  No lesions.  Vagina: No lesions or abnormalities noted.  Support: Normal pelvic support.  Urethra No masses tenderness or scarring.  Meatus Normal size without lesions or prolapse.  Cervix: Normal appearance.  No lesions.  Anus: Normal exam.  No lesions.  Perineum: Normal exam.  No lesions.        Bimanual   Uterus: Surgically absent  Adnexae: No masses.  Non-tender to palpation.  Cul-de-sac: Negative for abnormality.     Assessment:    G1P1001 Patient Active Problem List   Diagnosis Date Noted   Fatigue 08/11/2018   BV (bacterial vaginosis) 12/22/2016   Special screening for malignant neoplasms, colon    Benign neoplasm of ascending colon    Benign neoplasm of transverse colon    Benign neoplasm of sigmoid colon    Elevated blood pressure 07/31/2015   History of hysterectomy, supracervical 07/31/2015     1. Well woman exam with routine gynecological exam   2. Symptomatic menopausal or female climacteric states   3. Hormone replacement therapy (HRT)        Plan:            1.  Basic Screening Recommendations The basic screening recommendations for asymptomatic women were discussed with the patient during her visit.  The age-appropriate recommendations were discussed with her and the rational for the tests reviewed.  When I am informed by the patient that another primary care physician has previously obtained the age-appropriate tests and they are up-to-date, only outstanding tests are ordered and referrals given as necessary.  Abnormal results of tests will be discussed  with her when all of her results are completed.  Routine preventative health maintenance measures emphasized: Exercise/Diet/Weight control, Tobacco Warnings, Alcohol/Substance use risks and Stress Management Mammogram ordered 2.  Continue Estrace 3.  Lab work today  Orders Orders Placed This Encounter  Procedures   [facility] [place] metabolic panel   CBC   Hemoglobin A1c   Lipid panel   TSH     Meds ordered this encounter  Medications   estradiol (ESTRACE) 1 MG tablet    Sig: Take 1 tablet (1 mg total) by mouth daily.    Dispense:  90 tablet    Refill:  3          F/U  Return in about 1 year (around 08/27/2022) for Annual Physical.  Finis Bud, M.D. 08/27/2021 8:52 AM

## 2021-08-28 LAB — TSH: TSH: 0.99 u[IU]/mL (ref 0.450–4.500)

## 2021-08-28 LAB — CBC
Hematocrit: 42.5 % (ref 34.0–46.6)
Hemoglobin: 13.8 g/dL (ref 11.1–15.9)
MCH: 26.1 pg — ABNORMAL LOW (ref 26.6–33.0)
MCHC: 32.5 g/dL (ref 31.5–35.7)
MCV: 80 fL (ref 79–97)
Platelets: 252 10*3/uL (ref 150–450)
RBC: 5.29 x10E6/uL — ABNORMAL HIGH (ref 3.77–5.28)
RDW: 13.6 % (ref 11.7–15.4)
WBC: 4.6 10*3/uL (ref 3.4–10.8)

## 2021-08-28 LAB — BASIC METABOLIC PANEL
BUN/Creatinine Ratio: 12 (ref 9–23)
BUN: 12 mg/dL (ref 6–24)
CO2: 23 mmol/L (ref 20–29)
Calcium: 9 mg/dL (ref 8.7–10.2)
Chloride: 101 mmol/L (ref 96–106)
Creatinine, Ser: 0.98 mg/dL (ref 0.57–1.00)
Glucose: 80 mg/dL (ref 70–99)
Potassium: 4 mmol/L (ref 3.5–5.2)
Sodium: 139 mmol/L (ref 134–144)
eGFR: 67 mL/min/{1.73_m2} (ref >59)

## 2021-08-28 LAB — LIPID PANEL
Chol/HDL Ratio: 2.4 ratio (ref 0.0–4.4)
Cholesterol, Total: 184 mg/dL (ref 100–199)
HDL: 78 mg/dL (ref >39)
LDL Chol Calc (NIH): 93 mg/dL (ref 0–99)
Triglycerides: 71 mg/dL (ref 0–149)
VLDL Cholesterol Cal: 13 mg/dL (ref 5–40)

## 2021-08-28 LAB — HEMOGLOBIN A1C
Est. average glucose Bld gHb Est-mCnc: 111 mg/dL
Hgb A1c MFr Bld: 5.5 % (ref 4.8–5.6)

## 2021-09-30 ENCOUNTER — Other Ambulatory Visit: Payer: Self-pay

## 2021-09-30 ENCOUNTER — Ambulatory Visit
Admission: RE | Admit: 2021-09-30 | Discharge: 2021-09-30 | Disposition: A | Discharge status: Home / Self Care | Payer: 59 | Source: Ambulatory Visit | Attending: Obstetrics and Gynecology | Admitting: Obstetrics and Gynecology

## 2021-09-30 DIAGNOSIS — Z1231 Encounter for screening mammogram for malignant neoplasm of breast: Secondary | ICD-10-CM | POA: Insufficient documentation

## 2021-09-30 DIAGNOSIS — Z01419 Encounter for gynecological examination (general) (routine) without abnormal findings: Secondary | ICD-10-CM | POA: Diagnosis present

## 2021-12-02 ENCOUNTER — Other Ambulatory Visit: Payer: Self-pay

## 2021-12-15 ENCOUNTER — Ambulatory Visit: Payer: 59 | Admitting: Dermatology

## 2022-01-02 ENCOUNTER — Other Ambulatory Visit: Payer: Self-pay

## 2022-01-22 ENCOUNTER — Ambulatory Visit: Payer: 59 | Admitting: Dermatology

## 2022-01-22 DIAGNOSIS — L820 Inflamed seborrheic keratosis: Secondary | ICD-10-CM | POA: Diagnosis not present

## 2022-01-22 DIAGNOSIS — L509 Urticaria, unspecified: Secondary | ICD-10-CM

## 2022-01-22 DIAGNOSIS — L821 Other seborrheic keratosis: Secondary | ICD-10-CM | POA: Diagnosis not present

## 2022-01-22 DIAGNOSIS — L209 Atopic dermatitis, unspecified: Secondary | ICD-10-CM

## 2022-01-22 MED ORDER — MOMETASONE FUROATE 0.1 % EX CREA: 1.0000 "application " | TOPICAL_CREAM | Freq: Every day | CUTANEOUS | 1 refills | Status: DC | PRN

## 2022-01-22 NOTE — Patient Instructions (Addendum)
For urticaria/eczema at neck ?Start Allegra or Claritin (over the counter) take 1 by mouth daily.  ?Start mometasone cream - apply topically to neck daily as needed 5 x weekly. ? ? ? ?Seborrheic Keratosis ? ?What causes seborrheic keratoses? ?Seborrheic keratoses are harmless, common skin growths that first appear during adult life.  As time goes by, more growths appear.  Some people may develop a large number of them.  Seborrheic keratoses appear on both covered and uncovered body parts.  They are not caused by sunlight.  The tendency to develop seborrheic keratoses can be inherited.  They vary in color from skin-colored to gray, brown, or even black.  They can be either smooth or have a rough, warty surface.   ?Seborrheic keratoses are superficial and look as if they were stuck on the skin.  Under the microscope this type of keratosis looks like layers upon layers of skin.  That is why at times the top layer may seem to fall off, but the rest of the growth remains and re-grows.   ? ?Treatment ?Seborrheic keratoses do not need to be treated, but can easily be removed in the office.  Seborrheic keratoses often cause symptoms when they rub on clothing or jewelry.  Lesions can be in the way of shaving.  If they become inflamed, they can cause itching, soreness, or burning.  Removal of a seborrheic keratosis can be accomplished by freezing, burning, or surgery. ?If any spot bleeds, scabs, or grows rapidly, please return to have it checked, as these can be an indication of a skin cancer. ? ? ? ?Topical steroids (such as triamcinolone, fluocinolone, fluocinonide, mometasone, clobetasol, halobetasol, betamethasone, hydrocortisone) can cause thinning and lightening of the skin if they are used for too long in the same area. Your physician has selected the right strength medicine for your problem and area affected on the body. Please use your medication only as directed by your physician to prevent side effects.   ? ? ? ? ?If You Need Anything After Your Visit ? ?If you have any questions or concerns for your doctor, please call our main line at [phone] and press option 4 to reach your doctor's medical assistant. If no one answers, please leave a voicemail as directed and we will return your call as soon as possible. Messages left after 4 pm will be answered the following business day.  ? ?You may also send [place] a message via MyChart. We typically respond to MyChart messages within 1-2 business days. ? ?For prescription refills, please ask your pharmacy to contact our office. Our fax number is [phone]. ? ?If you have an urgent issue when the clinic is closed that cannot wait until the next business day, you can page your doctor at the number below.   ? ?Please note that while we do our best to be available for urgent issues outside of office hours, we are not available 24/7.  ? ?If you have an urgent issue and are unable to reach [place], you may choose to seek medical care at your doctor's office, retail clinic, urgent care center, or emergency room. ? ?If you have a medical emergency, please immediately call 911 or go to the emergency department. ? ?Pager Numbers ? ?- Dr. Nehemiah Massed: [phone] ? ?- Dr. Laurence Ferrari: [phone] ? ?- Dr. Nicole Kindred: [phone] ? ?In the event of inclement weather, please call our main line at [phone] for an update on the status of any delays or closures. ? ?Dermatology Medication Tips: ?Please  keep the boxes that topical medications come in in order to help keep track of the instructions about where and how to use these. Pharmacies typically print the medication instructions only on the boxes and not directly on the medication tubes.  ? ?If your medication is too expensive, please contact our office at [phone] option 4 or send [place] a message through [place].  ? ?We are unable to tell what your co-pay for medications will be in advance as this is different depending on your insurance  coverage. However, we may be able to find a substitute medication at lower cost or fill out paperwork to get insurance to cover a needed medication.  ? ?If a prior authorization is required to get your medication covered by your insurance company, please allow [place] 1-2 business days to complete this process. ? ?Drug prices often vary depending on where the prescription is filled and some pharmacies may offer cheaper prices. ? ?The website www.goodrx.com contains coupons for medications through different pharmacies. The prices here do not account for what the cost may be with help from insurance (it may be cheaper with your insurance), but the website can give you the price if you did not use any insurance.  ?- You can print the associated coupon and take it with your prescription to the pharmacy.  ?- You may also stop by our office during regular business hours and pick up a GoodRx coupon card.  ?- If you need your prescription sent electronically to a different pharmacy, notify our office through [facility] or by phone at [phone] option 4. ? ? ? ? ?Si Usted Necesita Algo Despu?s de Su Visita ? ?Tambi?n puede enviarnos un mensaje a trav?s de MyChart. Por lo general respondemos a los mensajes de MyChart en el transcurso de 1 a 2 d?as h?biles. ? ?Para renovar recetas, por favor pida a su farmacia que se ponga en contacto con nuestra oficina. Nuestro n?mero de fax es el [phone]. ? ?Si tiene un asunto urgente cuando la cl?nica est? cerrada y que no puede esperar hasta el siguiente d?a h?bil, puede llamar/localizar a su doctor(a) al n?mero que aparece a continuaci?n.  ? ?Por favor, tenga en cuenta que aunque hacemos todo lo posible para estar disponibles para asuntos urgentes fuera del horario de oficina, no estamos disponibles las 24 horas del d?a, los 7 d?as de la semana.  ? ?Si tiene un problema urgente y no puede comunicarse con nosotros, puede optar por buscar atenci?n m?dica  en el consultorio de  su doctor(a), en una cl?nica privada, en un centro de atenci?n urgente o en una sala de emergencias. ? ?Si tiene Engineer, maintenance (IT) m?dica, por favor llame inmediatamente al 911 o vaya a la sala de emergencias. ? ?N?meros de b?per ? ?- Dr. Nehemiah Massed: [phone] ? ?- Dra. Moye: [phone] ? ?- Dra. Nicole Kindred: [phone] ? ?En caso de inclemencias del tiempo, por favor llame a nuestra l?nea principal al [phone] para una actualizaci?n sobre el estado de cualquier retraso o cierre. ? ?Consejos para la medicaci?n en dermatolog?a: ?Por favor, guarde las cajas en las que vienen los medicamentos de uso t?pico para ayudarle a seguir las instrucciones sobre d?nde y c?mo usarlos. Las farmacias generalmente imprimen las instrucciones del medicamento s?lo en las cajas y no directamente en los tubos del [place].  ? ?Si su medicamento es muy caro, por favor, p?ngase en contacto con Zigmund Daniel llamando al [phone] y presione la opci?n 4 o env?enos un mensaje a trav?s de  MyChart.  ? ?No podemos decirle cu?l ser? su copago por los medicamentos por adelantado ya que esto es diferente dependiendo de la cobertura de su seguro. Sin embargo, es posible que podamos encontrar un medicamento sustituto a Electrical engineer un formulario para que el seguro cubra el medicamento que se considera necesario.  ? ?Si se requiere Ardelia Mems autorizaci?n previa para que su compa??a de seguros [place] su medicamento, por favor perm?tanos de 1 a 2 d?as h?biles para completar este proceso. ? ?Los precios de los medicamentos var?an con frecuencia dependiendo del Environmental consultant de d?nde se surte la receta y alguna farmacias pueden ofrecer precios m?s baratos. ? ?El sitio web www.goodrx.com tiene cupones para medicamentos de Airline pilot. Los precios aqu? no tienen en cuenta lo que podr?a costar con la ayuda del seguro (puede ser m?s barato con su seguro), pero el sitio web puede darle el precio si no utiliz? ning?n seguro.  ?- Puede imprimir el  cup?n correspondiente y llevarlo con su receta a la farmacia.  ?- Tambi?n puede pasar por nuestra oficina durante el horario de atenci?n regular y recoger una tarjeta de cupones de GoodRx.  ?- Si necesita que su recet

## 2022-01-22 NOTE — Progress Notes (Signed)
? ?  New Patient Visit ? ?Subjective  ?Tara [name] is a 56 y.o. female who presents for the following: New Patient (Initial Visit) (Break out at neck that itches, duration since nov - dec. Comes and goes. Patient has been to allergist and tested with negative results. /Some skin tags at neck but thinks fell off. ). Patient has irritated lesions on the neck that become itchy when she gets hot, and some rub on her jewelry.  ?The patient has spots, moles and lesions to be evaluated, some may be new or changing and the patient has concerns that these could be cancer. ? ?The following portions of the chart were reviewed this encounter and updated as appropriate:  ? Tobacco  Allergies  Meds  Problems  Med Hx  Surg Hx  Fam Hx   ?  ?Review of Systems:  No other skin or systemic complaints except as noted in HPI or Assessment and Plan. ? ?Objective  ?Well appearing patient in no apparent distress; mood and affect are within normal limits. ? ?A focused examination was performed including the face and neck. Relevant physical exam findings are noted in the Assessment and Plan. ? ?Neck ?Pink patches at neck with macular hyperpigmentation  ? ? ? ? ? ? ? ? ?R neck x 6 (6) ?Erythematous stuck-on, waxy papule or plaque ? ? ?Assessment & Plan  ?Atopic dermatitis, with Urticaria ?Neck ? ?Atopic dermatitis (eczema) is a chronic, relapsing, pruritic condition that can significantly affect quality of life. It is often associated with allergic rhinitis and/or asthma and can require treatment with topical medications, phototherapy, or in severe cases biologic injectable medication (Dupixent; Adbry) or Oral JAK inhibitors. ? ?Start Allegra or  Claritin 1 po qd  ?Start Mometasone 0.1% cream to aa's neck QD 5d/wk. ? ?If not responding to treatment will consider Patch testing  ? ?mometasone (ELOCON) 0.1 % cream - Neck ?Apply 1 application. topically daily as needed (Rash). Use at neck , 5 x weekly ? ?Inflamed seborrheic keratosis  (6) ?R neck x 6 ?Irritated by jewelry and rash - Will treat a few today on the R neck and see how they heal. If areas heal well will treat the L neck at next visit.  ? ?Destruction of lesion - R neck x 6 ?Complexity: simple   ?Destruction method comment:  Electrodesiccation ?Informed consent: discussed and consent obtained   ?Hemostasis achieved with:  electrodesiccation ?Outcome: patient tolerated procedure with difficulty   ? ?Seborrheic Keratoses ?- Stuck-on, waxy, tan-brown papules and/or plaques  ?- Benign-appearing ?- Discussed benign etiology and prognosis. ?- Observe ?- Call for any changes ? ?Return for 6 - 8 weeks isk / urticaria follow . ? ?I, Ruthell Rummage, CMA, am acting as scribe for Sarina Ser, MD. ?Documentation: I have reviewed the above documentation for accuracy and completeness, and I agree with the above. ? ?Sarina Ser, MD ? ? ?

## 2022-02-03 ENCOUNTER — Encounter: Payer: Self-pay | Admitting: Dermatology

## 2022-03-11 ENCOUNTER — Encounter: Payer: Self-pay | Admitting: Dermatology

## 2022-03-11 ENCOUNTER — Ambulatory Visit: Payer: 59 | Admitting: Dermatology

## 2022-03-11 DIAGNOSIS — L820 Inflamed seborrheic keratosis: Secondary | ICD-10-CM | POA: Diagnosis not present

## 2022-03-11 DIAGNOSIS — L509 Urticaria, unspecified: Secondary | ICD-10-CM

## 2022-03-11 DIAGNOSIS — L821 Other seborrheic keratosis: Secondary | ICD-10-CM

## 2022-03-11 DIAGNOSIS — L2089 Other atopic dermatitis: Secondary | ICD-10-CM

## 2022-03-11 NOTE — Progress Notes (Signed)
   Follow-Up Visit   Subjective  Tara [name] is a 56 y.o. female who presents for the following: Follow-up (Atopic derm with urticaria - has done very well with Mometasone cream) and Seborrheic Keratosis (ISK follow up of right neck treated with ED). The patient has spots, moles and lesions to be evaluated, some may be new or changing and the patient has concerns .  The following portions of the chart were reviewed this encounter and updated as appropriate:   Tobacco  Allergies  Meds  Problems  Med Hx  Surg Hx  Fam Hx     Review of Systems:  No other skin or systemic complaints except as noted in HPI or Assessment and Plan.  Objective  Well appearing patient in no apparent distress; mood and affect are within normal limits.  A focused examination was performed including face, neck. Relevant physical exam findings are noted in the Assessment and Plan.  left neck (16) Erythematous stuck-on, waxy papule or plaque   Assessment & Plan   Seborrheic Keratoses - Stuck-on, waxy, tan-brown papules and/or plaques  - Benign-appearing - Discussed benign etiology and prognosis. - Observe - Call for any changes  Inflamed seborrheic keratosis (16) left neck  Irritated by jewelry Symptomatic, irritating, patient would like treated.  Destruction of lesion - left neck Complexity: simple   Destruction method comment:  Electrodesiccation Informed consent: discussed and consent obtained   Timeout:  patient name, date of birth, surgical site, and procedure verified Patient was prepped and draped in usual sterile fashion: patient was prepped with isopropyl alcohol. Outcome: patient tolerated procedure well with no complications    Other atopic dermatitis Neck - Anterior With urticaria - Improved Atopic dermatitis (eczema) is a chronic, relapsing, pruritic condition that can significantly affect quality of life. It is often associated with allergic rhinitis and/or asthma and can  require treatment with topical medications, phototherapy, or in severe cases biologic injectable medication (Dupixent; Adbry) or Oral JAK inhibitors.   Decrease Mometasone cream to qd 3 times per week May consider patch testing in the future  Return in about 4 months (around 07/11/2022).  I, Ashok Cordia, CMA, am acting as scribe for Sarina Ser, MD . Documentation: I have reviewed the above documentation for accuracy and completeness, and I agree with the above.  Sarina Ser, MD

## 2022-03-11 NOTE — Patient Instructions (Signed)
Due to recent changes in healthcare laws, you may see results of your pathology and/or laboratory studies on MyChart before the doctors have had a chance to review them. We understand that in some cases there may be results that are confusing or concerning to you. Please understand that not all results are received at the same time and often the doctors may need to interpret multiple results in order to provide you with the best plan of care or course of treatment. Therefore, we ask that you please give us 2 business days to thoroughly review all your results before contacting the office for clarification. Should we see a critical lab result, you will be contacted sooner.   If You Need Anything After Your Visit  If you have any questions or concerns for your doctor, please call our main line at [phone] and press option 4 to reach your doctor's medical assistant. If no one answers, please leave a voicemail as directed and we will return your call as soon as possible. Messages left after 4 pm will be answered the following business day.   You may also send us a message via MyChart. We typically respond to MyChart messages within 1-2 business days.  For prescription refills, please ask your pharmacy to contact our office. Our fax number is [phone].  If you have an urgent issue when the clinic is closed that cannot wait until the next business day, you can page your doctor at the number below.    Please note that while we do our best to be available for urgent issues outside of office hours, we are not available 24/7.   If you have an urgent issue and are unable to reach us, you may choose to seek medical care at your doctor's office, retail clinic, urgent care center, or emergency room.  If you have a medical emergency, please immediately call 911 or go to the emergency department.  Pager Numbers  - Dr. Kowalski: [phone]  - Dr. Moye: [phone]  - Dr. Stewart:  [phone]  In the event of inclement weather, please call our main line at [phone] for an update on the status of any delays or closures.  Dermatology Medication Tips: Please keep the boxes that topical medications come in in order to help keep track of the instructions about where and how to use these. Pharmacies typically print the medication instructions only on the boxes and not directly on the medication tubes.   If your medication is too expensive, please contact our office at [phone] option 4 or send us a message through MyChart.   We are unable to tell what your co-pay for medications will be in advance as this is different depending on your insurance coverage. However, we may be able to find a substitute medication at lower cost or fill out paperwork to get insurance to cover a needed medication.   If a prior authorization is required to get your medication covered by your insurance company, please allow us 1-2 business days to complete this process.  Drug prices often vary depending on where the prescription is filled and some pharmacies may offer cheaper prices.  The website www.goodrx.com contains coupons for medications through different pharmacies. The prices here do not account for what the cost may be with help from insurance (it may be cheaper with your insurance), but the website can give you the price if you did not use any insurance.  - You can print the associated coupon and take it with   your prescription to the pharmacy.  - You may also stop by our office during regular business hours and pick up a GoodRx coupon card.  - If you need your prescription sent electronically to a different pharmacy, notify our office through [facility] MyChart or by phone at [phone] option 4.     Si Usted Necesita Algo Despus de Su Visita  Tambin puede enviarnos un mensaje a travs de MyChart. Por lo general respondemos a los mensajes de MyChart en el transcurso de 1 a 2  das hbiles.  Para renovar recetas, por favor pida a su farmacia que se ponga en contacto con nuestra oficina. Nuestro nmero de fax es el [phone].  Si tiene un asunto urgente cuando la clnica est cerrada y que no puede esperar hasta el siguiente da hbil, puede llamar/localizar a su doctor(a) al nmero que aparece a continuacin.   Por favor, tenga en cuenta que aunque hacemos todo lo posible para estar disponibles para asuntos urgentes fuera del horario de oficina, no estamos disponibles las 24 horas del da, los 7 das de la semana.   Si tiene un problema urgente y no puede comunicarse con nosotros, puede optar por buscar atencin mdica  en el consultorio de su doctor(a), en una clnica privada, en un centro de atencin urgente o en una sala de emergencias.  Si tiene una emergencia mdica, por favor llame inmediatamente al 911 o vaya a la sala de emergencias.  Nmeros de bper  - Dr. Kowalski: [phone]  - Dra. Moye: [phone]  - Dra. Stewart: [phone]  En caso de inclemencias del tiempo, por favor llame a nuestra lnea principal al [phone] para una actualizacin sobre el estado de cualquier retraso o cierre.  Consejos para la medicacin en dermatologa: Por favor, guarde las cajas en las que vienen los medicamentos de uso tpico para ayudarle a seguir las instrucciones sobre dnde y cmo usarlos. Las farmacias generalmente imprimen las instrucciones del medicamento slo en las cajas y no directamente en los tubos del medicamento.   Si su medicamento es muy caro, por favor, pngase en contacto con nuestra oficina llamando al [phone] y presione la opcin 4 o envenos un mensaje a travs de MyChart.   No podemos decirle cul ser su copago por los medicamentos por adelantado ya que esto es diferente dependiendo de la cobertura de su seguro. Sin embargo, es posible que podamos encontrar un medicamento sustituto a menor costo o llenar un formulario para que el  seguro cubra el medicamento que se considera necesario.   Si se requiere una autorizacin previa para que su compaa de seguros cubra su medicamento, por favor permtanos de 1 a 2 das hbiles para completar este proceso.  Los precios de los medicamentos varan con frecuencia dependiendo del lugar de dnde se surte la receta y alguna farmacias pueden ofrecer precios ms baratos.  El sitio web www.goodrx.com tiene cupones para medicamentos de diferentes farmacias. Los precios aqu no tienen en cuenta lo que podra costar con la ayuda del seguro (puede ser ms barato con su seguro), pero el sitio web puede darle el precio si no utiliz ningn seguro.  - Puede imprimir el cupn correspondiente y llevarlo con su receta a la farmacia.  - Tambin puede pasar por nuestra oficina durante el horario de atencin regular y recoger una tarjeta de cupones de GoodRx.  - Si necesita que su receta se enve electrnicamente a una farmacia diferente, informe a nuestra oficina a travs de MyChart de [facility]   o por telfono llamando al [phone] y presione la opcin 4.  

## 2022-04-10 ENCOUNTER — Ambulatory Visit (INDEPENDENT_AMBULATORY_CARE_PROVIDER_SITE_OTHER): Payer: 59 | Admitting: Obstetrics and Gynecology

## 2022-04-10 ENCOUNTER — Telehealth: Payer: Self-pay | Admitting: Obstetrics and Gynecology

## 2022-04-10 DIAGNOSIS — B379 Candidiasis, unspecified: Secondary | ICD-10-CM

## 2022-04-10 NOTE — Progress Notes (Signed)
Pt presents for vaginal self swab due to possible yeast infection. Pt pharmacy confirmed [facility] in [place], all questions answered.

## 2022-04-10 NOTE — Telephone Encounter (Signed)
Pharmacy has been updated.

## 2022-04-10 NOTE — Telephone Encounter (Signed)
Pt would like to update her Pharmacy choice to [facility] in [place] on [address] .

## 2022-04-16 ENCOUNTER — Telehealth: Payer: Self-pay | Admitting: Obstetrics and Gynecology

## 2022-04-16 ENCOUNTER — Other Ambulatory Visit: Payer: Self-pay

## 2022-04-16 DIAGNOSIS — B9689 Other specified bacterial agents as the cause of diseases classified elsewhere: Secondary | ICD-10-CM

## 2022-04-16 LAB — NUSWAB BV AND CANDIDA, NAA
Atopobium vaginae: HIGH Score — AB
BVAB 2: HIGH Score — AB
Candida albicans, NAA: NEGATIVE
Candida glabrata, NAA: NEGATIVE
Megasphaera 1: HIGH Score — AB

## 2022-04-16 MED ORDER — METRONIDAZOLE 500 MG PO TABS: 500.0000 mg | ORAL_TABLET | Freq: Two times a day (BID) | ORAL | 0 refills | Status: DC

## 2022-04-16 NOTE — Telephone Encounter (Signed)
Pt called and stated that she would like someone to call and discuss her lab results with her. Please advise.

## 2022-04-16 NOTE — Telephone Encounter (Signed)
Spoke with patient, Flagyl sent in per office protocol.

## 2022-04-16 NOTE — Progress Notes (Signed)
Per office protocol.

## 2022-07-13 ENCOUNTER — Ambulatory Visit: Payer: 59 | Admitting: Dermatology

## 2022-07-13 DIAGNOSIS — L821 Other seborrheic keratosis: Secondary | ICD-10-CM | POA: Diagnosis not present

## 2022-07-13 DIAGNOSIS — L309 Dermatitis, unspecified: Secondary | ICD-10-CM | POA: Diagnosis not present

## 2022-07-13 DIAGNOSIS — L509 Urticaria, unspecified: Secondary | ICD-10-CM | POA: Diagnosis not present

## 2022-07-13 DIAGNOSIS — L820 Inflamed seborrheic keratosis: Secondary | ICD-10-CM | POA: Diagnosis not present

## 2022-07-13 MED ORDER — MOMETASONE FUROATE 0.1 % EX CREA: 1.0000 | TOPICAL_CREAM | Freq: Every day | CUTANEOUS | 2 refills | Status: AC | PRN

## 2022-07-13 NOTE — Progress Notes (Signed)
   Follow-Up Visit   Subjective  Tara [name] is a 56 y.o. female who presents for the following: Follow-up. Patient here today for 4 month ISK follow up at neck. She is using mometasone at neck for eczema and would like refills.  The patient has spots, moles and lesions to be evaluated, some may be new or changing and the patient has concerns that these could be cancer.  The following portions of the chart were reviewed this encounter and updated as appropriate:   Tobacco  Allergies  Meds  Problems  Med Hx  Surg Hx  Fam Hx     Review of Systems:  No other skin or systemic complaints except as noted in HPI or Assessment and Plan.  Objective  Well appearing patient in no apparent distress; mood and affect are within normal limits.  A focused examination was performed including face, neck. Relevant physical exam findings are noted in the Assessment and Plan.  R lateral neck x 2, R cheek x 1 (3) Erythematous stuck-on, waxy papule or plaque  Neck - Anterior Scaly erythematous papules and patches +/- dyspigmentation, lichenification, excoriations.    Assessment & Plan  Inflamed seborrheic keratosis (3) R lateral neck x 2, R cheek x 1 Symptomatic, irritating, patient would like treated. Destruction of lesion - R lateral neck x 2, R cheek x 1 Complexity: extensive   Destruction method comment:  Electrodessication Informed consent: discussed and consent obtained   Timeout:  patient name, date of birth, surgical site, and procedure verified Procedure prep:  Patient was prepped and draped in usual sterile fashion Prep type:  Isopropyl alcohol Hemostasis achieved with:  electrodesiccation Outcome: patient tolerated procedure well with no complications   Post-procedure details: sterile dressing applied and wound care instructions given   Dressing type: petrolatum    Eczema, with urticaria  neck - Anterior Chronic and persistent condition with duration or expected duration  over one year. Condition is symptomatic / bothersome to patient. Not to goal. Continue mometasone topically daily as needed (Rash). Use at neck , up to 5 x weekly. Avoid applying to face, groin, and axilla. Use as directed. Long-term use can cause thinning of the skin.  Add nonsedating antihistamine such as Allegra or Claritin if needed for the hive reaction component.  Consider patch testing in the future.  Topical steroids (such as triamcinolone, fluocinolone, fluocinonide, mometasone, clobetasol, halobetasol, betamethasone, hydrocortisone) can cause thinning and lightening of the skin if they are used for too long in the same area. Your physician has selected the right strength medicine for your problem and area affected on the body. Please use your medication only as directed by your physician to prevent side effects.   Related Medications mometasone (ELOCON) 0.1 % cream Apply 1 Application topically daily as needed (Rash). Use at neck , 5 x weekly  Seborrheic Keratoses - Stuck-on, waxy, tan-brown papules and/or plaques  - Benign-appearing - Discussed benign etiology and prognosis. - Observe - Call for any changes  Return if symptoms worsen or fail to improve.  Graciella Belton, RMA, am acting as scribe for Sarina Ser, MD . Documentation: I have reviewed the above documentation for accuracy and completeness, and I agree with the above.  Sarina Ser, MD

## 2022-07-13 NOTE — Patient Instructions (Signed)
Due to recent changes in healthcare laws, you may see results of your pathology and/or laboratory studies on MyChart before the doctors have had a chance to review them. We understand that in some cases there may be results that are confusing or concerning to you. Please understand that not all results are received at the same time and often the doctors may need to interpret multiple results in order to provide you with the best plan of care or course of treatment. Therefore, we ask that you please give us 2 business days to thoroughly review all your results before contacting the office for clarification. Should we see a critical lab result, you will be contacted sooner.   If You Need Anything After Your Visit  If you have any questions or concerns for your doctor, please call our main line at [phone] and press option 4 to reach your doctor's medical assistant. If no one answers, please leave a voicemail as directed and we will return your call as soon as possible. Messages left after 4 pm will be answered the following business day.   You may also send us a message via MyChart. We typically respond to MyChart messages within 1-2 business days.  For prescription refills, please ask your pharmacy to contact our office. Our fax number is [phone].  If you have an urgent issue when the clinic is closed that cannot wait until the next business day, you can page your doctor at the number below.    Please note that while we do our best to be available for urgent issues outside of office hours, we are not available 24/7.   If you have an urgent issue and are unable to reach us, you may choose to seek medical care at your doctor's office, retail clinic, urgent care center, or emergency room.  If you have a medical emergency, please immediately call 911 or go to the emergency department.  Pager Numbers  - Dr. Kowalski: [phone]  - Dr. Moye: [phone]  - Dr. Stewart:  [phone]  In the event of inclement weather, please call our main line at [phone] for an update on the status of any delays or closures.  Dermatology Medication Tips: Please keep the boxes that topical medications come in in order to help keep track of the instructions about where and how to use these. Pharmacies typically print the medication instructions only on the boxes and not directly on the medication tubes.   If your medication is too expensive, please contact our office at [phone] option 4 or send us a message through MyChart.   We are unable to tell what your co-pay for medications will be in advance as this is different depending on your insurance coverage. However, we may be able to find a substitute medication at lower cost or fill out paperwork to get insurance to cover a needed medication.   If a prior authorization is required to get your medication covered by your insurance company, please allow us 1-2 business days to complete this process.  Drug prices often vary depending on where the prescription is filled and some pharmacies may offer cheaper prices.  The website www.goodrx.com contains coupons for medications through different pharmacies. The prices here do not account for what the cost may be with help from insurance (it may be cheaper with your insurance), but the website can give you the price if you did not use any insurance.  - You can print the associated coupon and take it with   your prescription to the pharmacy.  - You may also stop by our office during regular business hours and pick up a GoodRx coupon card.  - If you need your prescription sent electronically to a different pharmacy, notify our office through [facility] MyChart or by phone at [phone] option 4.     Si Usted Necesita Algo Despus de Su Visita  Tambin puede enviarnos un mensaje a travs de MyChart. Por lo general respondemos a los mensajes de MyChart en el transcurso de 1 a 2  das hbiles.  Para renovar recetas, por favor pida a su farmacia que se ponga en contacto con nuestra oficina. Nuestro nmero de fax es el [phone].  Si tiene un asunto urgente cuando la clnica est cerrada y que no puede esperar hasta el siguiente da hbil, puede llamar/localizar a su doctor(a) al nmero que aparece a continuacin.   Por favor, tenga en cuenta que aunque hacemos todo lo posible para estar disponibles para asuntos urgentes fuera del horario de oficina, no estamos disponibles las 24 horas del da, los 7 das de la semana.   Si tiene un problema urgente y no puede comunicarse con nosotros, puede optar por buscar atencin mdica  en el consultorio de su doctor(a), en una clnica privada, en un centro de atencin urgente o en una sala de emergencias.  Si tiene una emergencia mdica, por favor llame inmediatamente al 911 o vaya a la sala de emergencias.  Nmeros de bper  - Dr. Kowalski: [phone]  - Dra. Moye: [phone]  - Dra. Stewart: [phone]  En caso de inclemencias del tiempo, por favor llame a nuestra lnea principal al [phone] para una actualizacin sobre el estado de cualquier retraso o cierre.  Consejos para la medicacin en dermatologa: Por favor, guarde las cajas en las que vienen los medicamentos de uso tpico para ayudarle a seguir las instrucciones sobre dnde y cmo usarlos. Las farmacias generalmente imprimen las instrucciones del medicamento slo en las cajas y no directamente en los tubos del medicamento.   Si su medicamento es muy caro, por favor, pngase en contacto con nuestra oficina llamando al [phone] y presione la opcin 4 o envenos un mensaje a travs de MyChart.   No podemos decirle cul ser su copago por los medicamentos por adelantado ya que esto es diferente dependiendo de la cobertura de su seguro. Sin embargo, es posible que podamos encontrar un medicamento sustituto a menor costo o llenar un formulario para que el  seguro cubra el medicamento que se considera necesario.   Si se requiere una autorizacin previa para que su compaa de seguros cubra su medicamento, por favor permtanos de 1 a 2 das hbiles para completar este proceso.  Los precios de los medicamentos varan con frecuencia dependiendo del lugar de dnde se surte la receta y alguna farmacias pueden ofrecer precios ms baratos.  El sitio web www.goodrx.com tiene cupones para medicamentos de diferentes farmacias. Los precios aqu no tienen en cuenta lo que podra costar con la ayuda del seguro (puede ser ms barato con su seguro), pero el sitio web puede darle el precio si no utiliz ningn seguro.  - Puede imprimir el cupn correspondiente y llevarlo con su receta a la farmacia.  - Tambin puede pasar por nuestra oficina durante el horario de atencin regular y recoger una tarjeta de cupones de GoodRx.  - Si necesita que su receta se enve electrnicamente a una farmacia diferente, informe a nuestra oficina a travs de MyChart de [facility]   o por telfono llamando al [phone] y presione la opcin 4.  

## 2022-07-15 ENCOUNTER — Other Ambulatory Visit: Payer: Self-pay | Admitting: Obstetrics and Gynecology

## 2022-07-15 DIAGNOSIS — Z79890 Hormone replacement therapy (postmenopausal): Secondary | ICD-10-CM

## 2022-07-15 DIAGNOSIS — N951 Menopausal and female climacteric states: Secondary | ICD-10-CM

## 2022-07-15 NOTE — Telephone Encounter (Signed)
1 refill sent in until patient is seen for annual in December

## 2022-07-25 ENCOUNTER — Encounter: Payer: Self-pay | Admitting: Dermatology

## 2022-08-28 ENCOUNTER — Ambulatory Visit (INDEPENDENT_AMBULATORY_CARE_PROVIDER_SITE_OTHER): Payer: 59 | Admitting: Obstetrics and Gynecology

## 2022-08-28 ENCOUNTER — Encounter: Payer: Self-pay | Admitting: Obstetrics and Gynecology

## 2022-08-28 VITALS — BP 165/89 | HR 69 | Ht 68.0 in | Wt 191.7 lb

## 2022-08-28 DIAGNOSIS — Z79890 Hormone replacement therapy (postmenopausal): Secondary | ICD-10-CM

## 2022-08-28 DIAGNOSIS — N951 Menopausal and female climacteric states: Secondary | ICD-10-CM

## 2022-08-28 DIAGNOSIS — Z1231 Encounter for screening mammogram for malignant neoplasm of breast: Secondary | ICD-10-CM

## 2022-08-28 DIAGNOSIS — Z01419 Encounter for gynecological examination (general) (routine) without abnormal findings: Secondary | ICD-10-CM | POA: Diagnosis not present

## 2022-08-28 MED ORDER — ESTRADIOL 1 MG PO TABS: 1.0000 mg | ORAL_TABLET | Freq: Every day | ORAL | 3 refills | Status: DC

## 2022-08-28 NOTE — Progress Notes (Signed)
Patients presents for annual exam today. She states doing well with HRT. She is up to date on pap smear, history of hysterectomy but cervix remains. Patient is due for mammogram in January, ordered. Annual labs are previously done by employer. Patient states no other questions or concerns at this time.

## 2022-08-28 NOTE — Progress Notes (Signed)
HPI:      Tara Tara [name] is a 56 y.o. G1P1001 who LMP was No LMP recorded. Tara [name] has had a hysterectomy.  Subjective:   She presents today for her annual examination.  She states that she is still struggling to lose weight despite walking regularly and drinking lots of water.  HRT without problem.  Denies bleeding.    Hx: The following portions of the Tara [name]'s history were reviewed and updated as appropriate:             She  has a past medical history of Breast lump in female, Elevated BP, Endometriosis of intestine, Fibroid, Night sweats, Perimenopausal, Vaginal Pap smear, abnormal, and Wears contact lenses. She does not have any pertinent problems on file. She  has a past surgical history that includes Breast surgery (Right); Abdominal hysterectomy (2008); Colonoscopy with propofol (N/A, 08/30/2015); polypectomy (08/30/2015); Oophorectomy; and Breast excisional biopsy (Right). Her family history includes Breast cancer in her sister. She  reports that she has never smoked. She has never used smokeless tobacco. She reports current alcohol use. She reports that she does not use drugs. She has a current medication list which includes the following prescription(s): aspirin, bacillus coagulans-inulin, cholecalciferol, collagen, estradiol, mometasone, and multivitamin. She has No Known Allergies.       Review of Systems:  Review of Systems  Constitutional: Denied constitutional symptoms, night sweats, recent illness, fatigue, fever, insomnia and weight loss.  Eyes: Denied eye symptoms, eye pain, photophobia, vision change and visual disturbance.  Ears/Nose/Throat/Neck: Denied ear, nose, throat or neck symptoms, hearing loss, nasal discharge, sinus congestion and sore throat.  Cardiovascular: Denied cardiovascular symptoms, arrhythmia, chest pain/pressure, edema, exercise intolerance, orthopnea and palpitations.  Respiratory: Denied pulmonary symptoms, asthma, pleuritic pain, productive  sputum, cough, dyspnea and wheezing.  Gastrointestinal: Denied, gastro-esophageal reflux, melena, nausea and vomiting.  Genitourinary: Denied genitourinary symptoms including symptomatic vaginal discharge, pelvic relaxation issues, and urinary complaints.  Musculoskeletal: Denied musculoskeletal symptoms, stiffness, swelling, muscle weakness and myalgia.  Dermatologic: Denied dermatology symptoms, rash and scar.  Neurologic: Denied neurology symptoms, dizziness, headache, neck pain and syncope.  Psychiatric: Denied psychiatric symptoms, anxiety and depression.  Endocrine: Denied endocrine symptoms including hot flashes and night sweats.   Meds:   Current Outpatient Medications on File Prior to Visit  Medication Sig Dispense Refill   aspirin 81 MG chewable tablet Chew by mouth daily.     Bacillus Coagulans-Inulin (ALIGN PREBIOTIC-PROBIOTIC PO) Take by mouth.     cholecalciferol (VITAMIN D) 1000 UNITS tablet Take 1,000 Units by mouth daily.     Collagen 500 MG CAPS Take by mouth.     estradiol (ESTRACE) 1 MG tablet TAKE 1 TABLET BY MOUTH EVERY DAY 210 tablet 0   mometasone (ELOCON) 0.1 % cream Apply 1 Application topically daily as needed (Rash). Use at neck , 5 x weekly 45 g 2   Multiple Vitamin (MULTIVITAMIN) tablet Take 1 tablet by mouth daily.     No current facility-administered medications on file prior to visit.     Objective:     Vitals:   08/28/22 0911  BP: (!) 165/89  Pulse: 69    Filed Weights   08/28/22 0911  Weight: 191 lb 11.2 oz (87 kg)              Physical examination General NAD, Conversant  HEENT Atraumatic; Op clear with mmm.  Normo-cephalic. Pupils reactive. Anicteric sclerae  Thyroid/Neck Smooth without nodularity or enlargement. Normal ROM.  Neck Supple.  Skin  No rashes, lesions or ulceration. Normal palpated skin turgor. No nodularity.  Breasts: No masses or discharge.  Symmetric.  No axillary adenopathy.  Lungs: Clear to auscultation.No rales or  wheezes. Normal Respiratory effort, no retractions.  Heart: NSR.  No murmurs or rubs appreciated. No periferal edema  Abdomen: Soft.  Non-tender.  No masses.  No HSM. No hernia  Extremities: Moves all appropriately.  Normal ROM for age. No lymphadenopathy.  Neuro: Oriented to PPT.  Normal mood. Normal affect.     Pelvic:   Vulva: Normal appearance.  No lesions.  Vagina: No lesions or abnormalities noted.  Support: Normal pelvic support.  Urethra No masses tenderness or scarring.  Meatus Normal size without lesions or prolapse.  Cervix: Normal appearance.  No lesions.  Anus: Normal exam.  No lesions.  Perineum: Normal exam.  No lesions.        Bimanual   Uterus: Surgically absent  Adnexae: No masses.  Non-tender to palpation.  Cul-de-sac: Negative for abnormality.     Assessment:    G1P1001 Tara [name] Active Problem List   Diagnosis Date Noted   Fatigue 08/11/2018   BV (bacterial vaginosis) 12/22/2016   Special screening for malignant neoplasms, colon    Benign neoplasm of ascending colon    Benign neoplasm of transverse colon    Benign neoplasm of sigmoid colon    Elevated blood pressure 07/31/2015   History of hysterectomy, supracervical 07/31/2015     1. Well woman exam with routine gynecological exam   2. Screening mammogram for breast cancer   3. Hormone replacement therapy (HRT)     Generally doing well   Plan:            1.  Basic Screening Recommendations The basic screening recommendations for asymptomatic women were discussed with the Tara [name] during her visit.  The age-appropriate recommendations were discussed with her and the rational for the tests reviewed.  When I am informed by the Tara [name] that another primary care physician has previously obtained the age-appropriate tests and they are up-to-date, only outstanding tests are ordered and referrals given as necessary.  Abnormal results of tests will be discussed with her when all of her results are completed.   Routine preventative health maintenance measures emphasized: Exercise/Diet/Weight control, Tobacco Warnings, Alcohol/Substance use risks and Stress Management Continue HRT Mammogram as ordered Orders Orders Placed This Encounter  Procedures   MM DIGITAL SCREENING BILATERAL    No orders of the defined types were placed in this encounter.         F/U  Return in about 1 year (around 08/29/2023) for Annual Physical.  Finis Bud, M.D. 08/28/2022 9:38 AM

## 2022-09-07 ENCOUNTER — Other Ambulatory Visit: Payer: Self-pay | Admitting: Obstetrics and Gynecology

## 2022-09-07 DIAGNOSIS — Z1231 Encounter for screening mammogram for malignant neoplasm of breast: Secondary | ICD-10-CM

## 2022-10-01 ENCOUNTER — Ambulatory Visit
Admission: RE | Admit: 2022-10-01 | Discharge: 2022-10-01 | Disposition: A | Discharge status: Home / Self Care | Payer: 59 | Source: Ambulatory Visit | Attending: Obstetrics and Gynecology | Admitting: Obstetrics and Gynecology

## 2022-10-01 DIAGNOSIS — Z1231 Encounter for screening mammogram for malignant neoplasm of breast: Secondary | ICD-10-CM | POA: Diagnosis not present

## 2022-10-23 IMAGING — MG MM DIGITAL SCREENING BILAT W/ TOMO AND CAD
6 of 10 series · 6 of 30 positions shown · non-contrast
Comparison: Previous exam(s).

CLINICAL DATA: Screening.

EXAM:
DIGITAL SCREENING BILATERAL MAMMOGRAM WITH TOMOSYNTHESIS AND CAD
TECHNIQUE: Bilateral screening digital craniocaudal and mediolateral oblique
mammograms were obtained. Bilateral screening digital breast
tomosynthesis was performed. The images were evaluated with
computer-aided detection.

[L CC synth-2D]
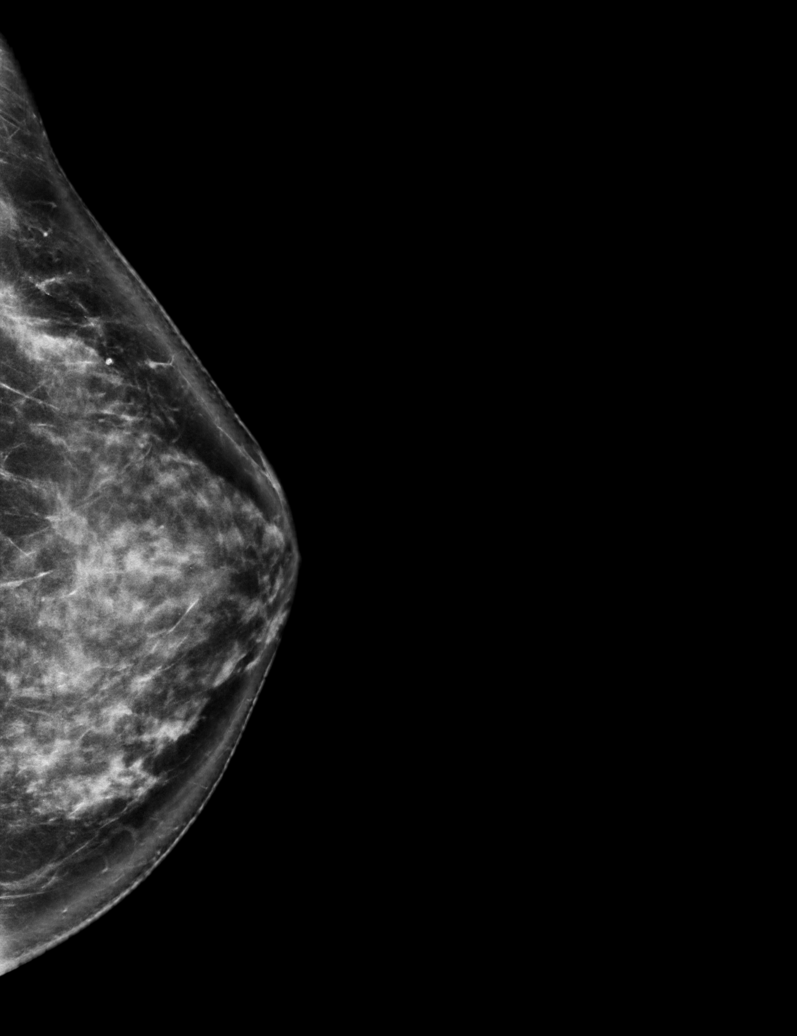

[R CC synth-2D (1 of 2)]
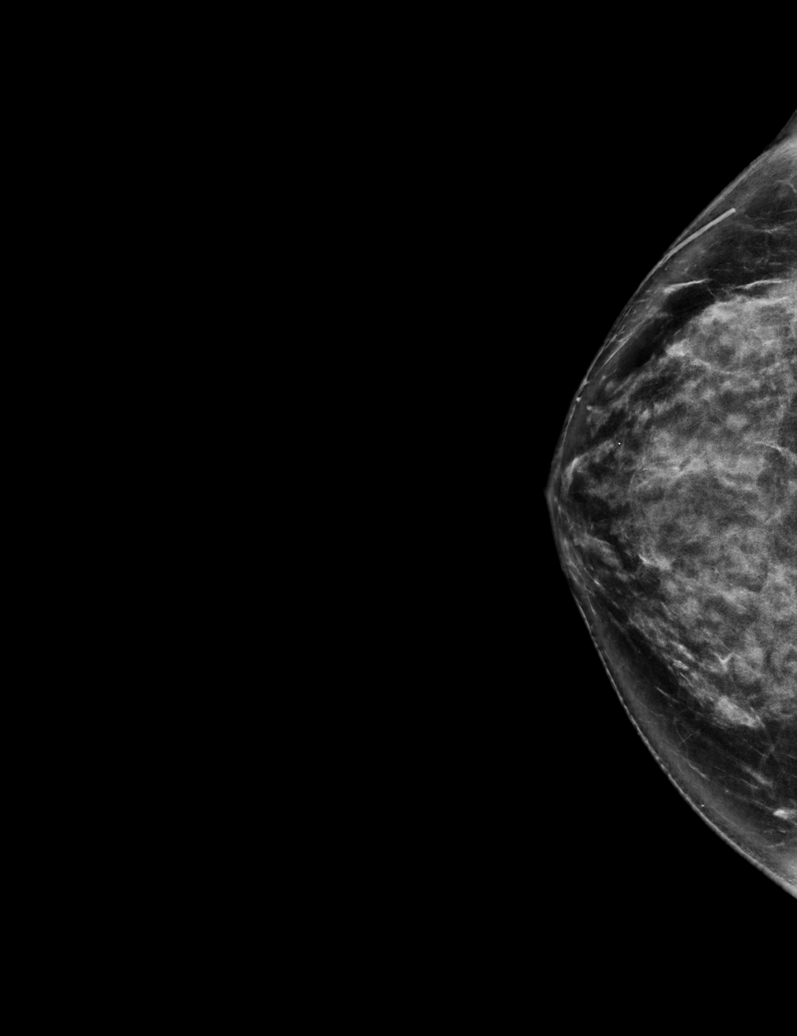

[R MLO synth-2D]
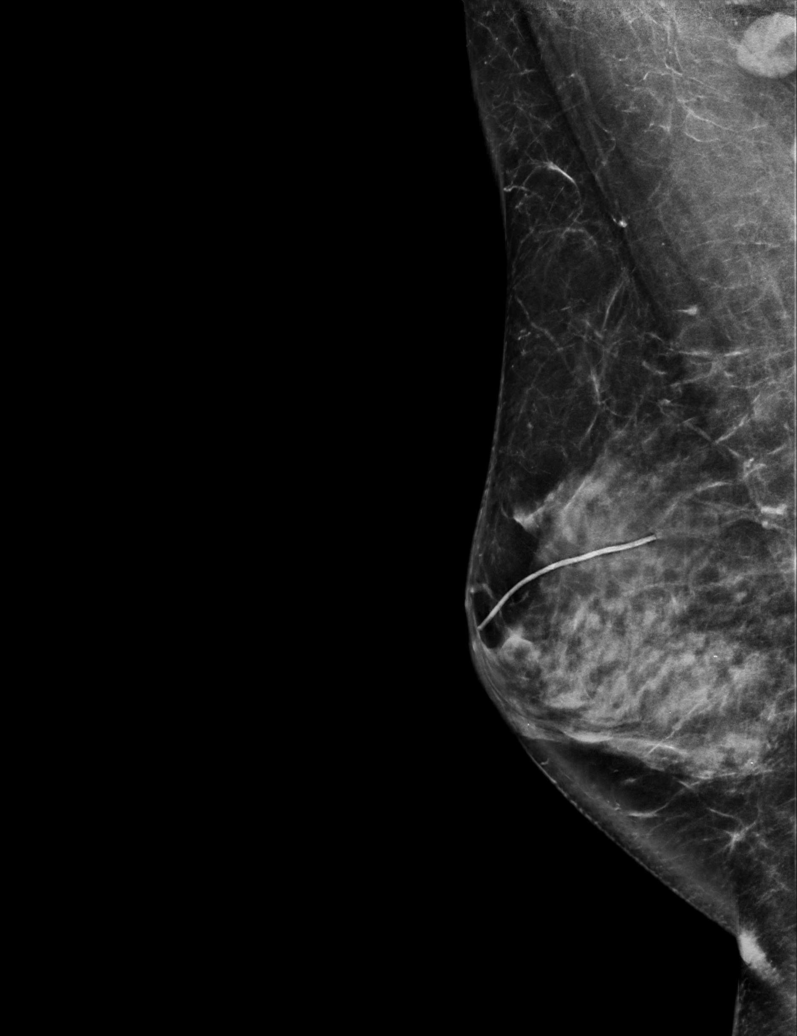

[R CC synth-2D (2 of 2)]
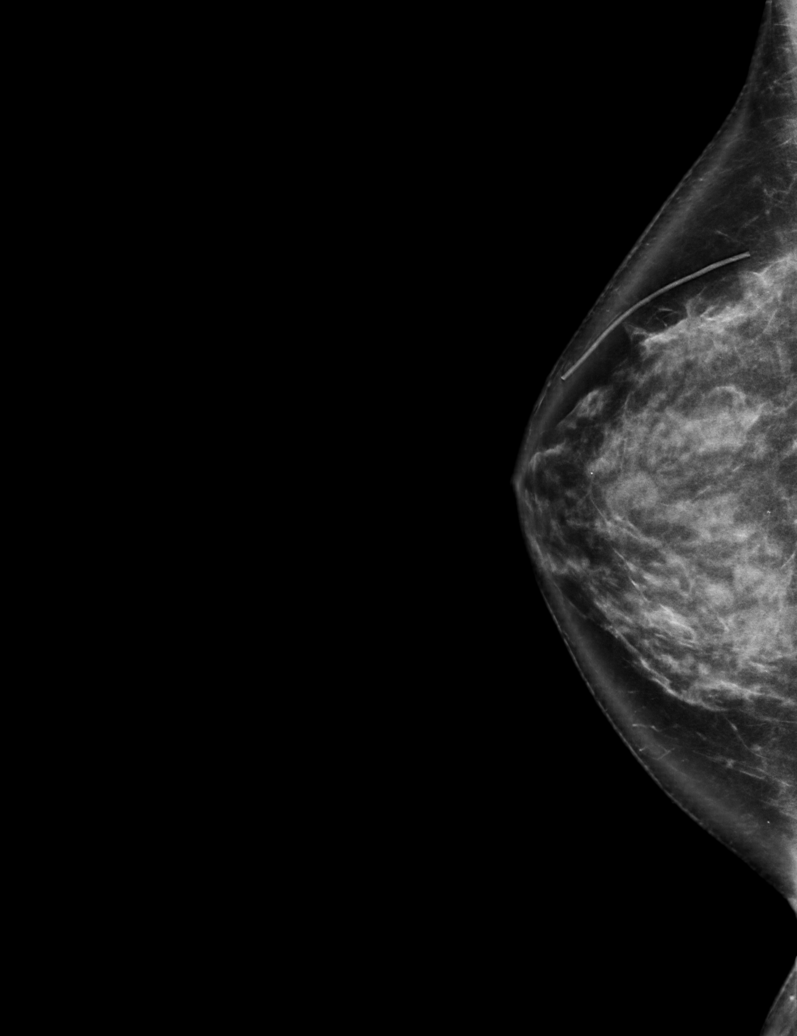

[L MLO synth-2D]
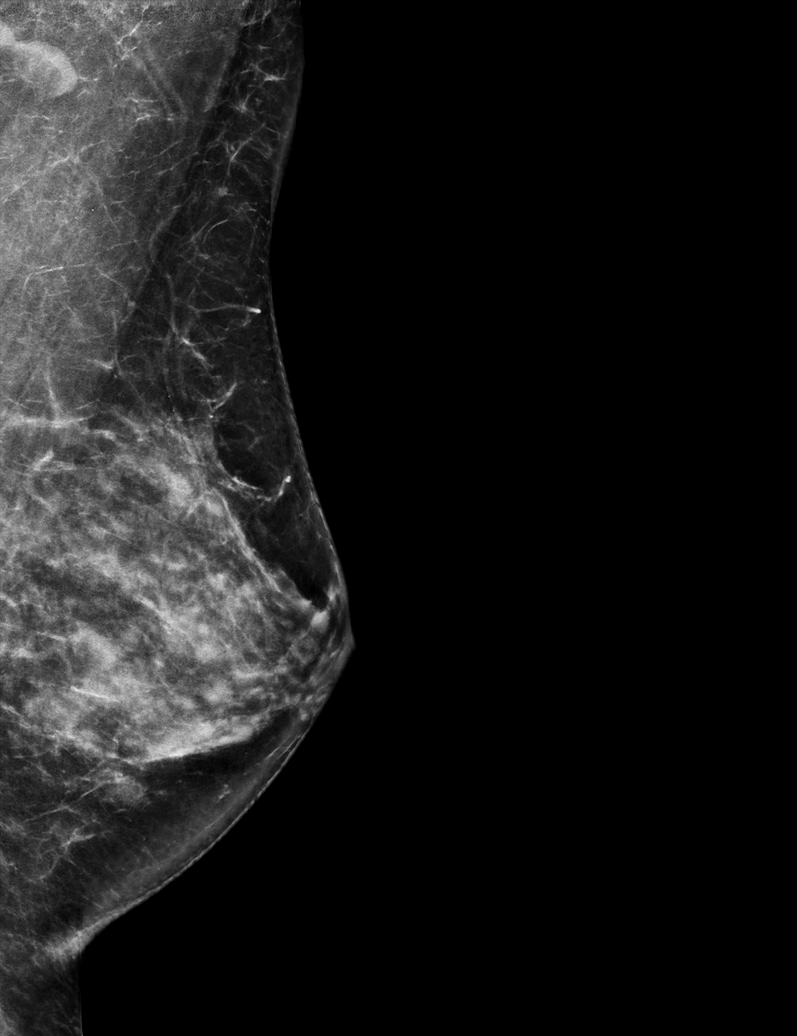

[R CC tomo · tomo slice 43/84.0]
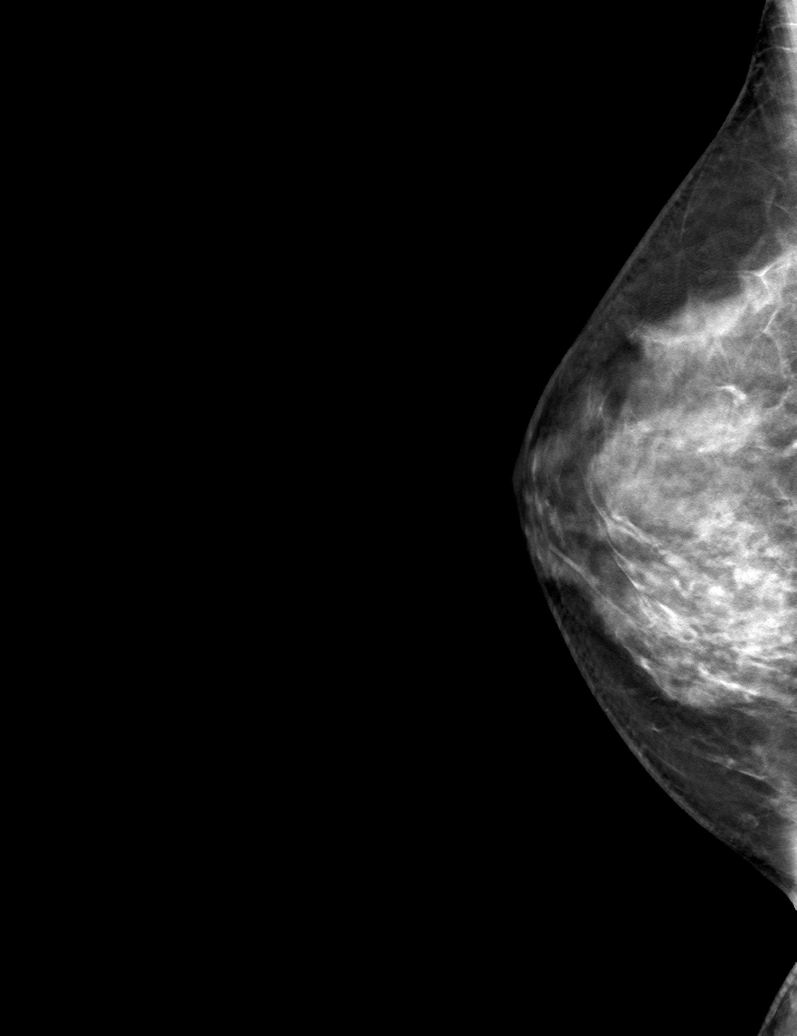

[6 of 30 positions shown; findings below may reference images not displayed]

ACR Breast Density Category d: The breast tissue is extremely dense,
which lowers the sensitivity of mammography
FINDINGS: There are no findings suspicious for malignancy.
IMPRESSION: No mammographic evidence of malignancy. A result letter of this
screening mammogram will be mailed directly to the patient.

RECOMMENDATION:
Screening mammogram in one year. (Code:TA-[id number])

BI-RADS CATEGORY  1: Negative.

## 2023-06-16 ENCOUNTER — Other Ambulatory Visit: Payer: Self-pay | Admitting: Unknown Physician Specialty

## 2023-06-16 ENCOUNTER — Encounter: Payer: Self-pay | Admitting: Unknown Physician Specialty

## 2023-06-16 DIAGNOSIS — J3800 Paralysis of vocal cords and larynx, unspecified: Secondary | ICD-10-CM

## 2023-06-16 DIAGNOSIS — R490 Dysphonia: Secondary | ICD-10-CM

## 2023-07-01 ENCOUNTER — Ambulatory Visit
Admission: RE | Admit: 2023-07-01 | Discharge: 2023-07-01 | Disposition: A | Discharge status: Home / Self Care | Payer: 59 | Source: Ambulatory Visit | Attending: Unknown Physician Specialty | Admitting: Unknown Physician Specialty

## 2023-07-01 DIAGNOSIS — R490 Dysphonia: Secondary | ICD-10-CM

## 2023-07-01 DIAGNOSIS — J3800 Paralysis of vocal cords and larynx, unspecified: Secondary | ICD-10-CM

## 2023-07-01 MED ORDER — IOPAMIDOL (ISOVUE-300) INJECTION 61%
100.0000 mL | Freq: Once | INTRAVENOUS | Status: AC | PRN
  Administered 2023-07-01: 100 mL via INTRAVENOUS

## 2023-07-05 ENCOUNTER — Ambulatory Visit: Payer: 59 | Attending: Unknown Physician Specialty

## 2023-07-05 DIAGNOSIS — R490 Dysphonia: Secondary | ICD-10-CM | POA: Insufficient documentation

## 2023-07-05 NOTE — Therapy (Signed)
OUTPATIENT SPEECH LANGUAGE PATHOLOGY  VOICE EVALUATION   Patient Name: Tara [name] MRN: [id number] DOB:11/03/1965, 57 y.o., female Today's Date: 07/05/2023  PCP: Dr. Charlton Haws  REFERRING PROVIDER: Dr. Linus Salmons   End of Session - 07/05/23 1432     Visit Number 1    Number of Visits 24    Date for SLP Re-Evaluation 09/27/23    Progress Note Due on Visit 10    SLP Start Time 1400    SLP Stop Time  1445    SLP Time Calculation (min) 45 min    Activity Tolerance Patient tolerated treatment well             Past Medical History:  Diagnosis Date   Breast lump in female    rt side 10 oclock   Elevated BP    Endometriosis of intestine    Sigmoid colon polyp   Fibroid    h/o   Night sweats    Perimenopausal    Vaginal Pap smear, abnormal    ascus.pos   Wears contact lenses    Past Surgical History:  Procedure Laterality Date   ABDOMINAL HYSTERECTOMY  2008   lsh/rso   BREAST EXCISIONAL BIOPSY Right    benign   BREAST SURGERY Right    lumpectomy    COLONOSCOPY WITH PROPOFOL N/A 08/30/2015   Procedure: COLONOSCOPY WITH PROPOFOL;  Surgeon: Midge Minium, MD;  Location: [facility];  Service: Endoscopy;  Laterality: N/A;   OOPHORECTOMY     one ovary left   POLYPECTOMY  08/30/2015   Procedure: POLYPECTOMY;  Surgeon: Midge Minium, MD;  Location: [facility];  Service: Endoscopy;;   Patient Active Problem List   Diagnosis Date Noted   Fatigue 08/11/2018   BV (bacterial vaginosis) 12/22/2016   Special screening for malignant neoplasms, colon    Benign neoplasm of ascending colon    Benign neoplasm of transverse colon    Benign neoplasm of sigmoid colon    Elevated blood pressure 07/31/2015   History of hysterectomy, supracervical 07/31/2015    ONSET DATE:  referral date 06/16/23; late June 2024 onset per pt report   REFERRING DIAG: dysphonia  THERAPY DIAG:  Dysphonia  Rationale for Evaluation and Treatment Rehabilitation  SUBJECTIVE:    SUBJECTIVE STATEMENT: Pt alert, pleasant, and cooperative. Pt accompanied by: self  PERTINENT HISTORY: 57 y.o. female referred by Dr. Jenne Campus for chief complaint of a sudden onset of mild, intermittent hoarseness which started in June 2024.   DIAGNOSTIC FINDINGS: Laryngoscopy on 06/15/23 noted, paramedian L vocal cord paralysis with "good compensation from the R cord." Results of Head CT, CT soft tissue neck, and CT chest on 07/01/23 pending.   PAIN:  Are you having pain? No   FALLS: Has patient fallen in last 6 months? 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 intake: optimal Caffeine/alcohol intake: minimal Daily voice use: minimal and moderate Environmental risks: Dry or dusty environment Occupational risks: some talking at work Misuse: Speaks without adequate breath support Phonotraumatic behaviors: Other:  PERCEPTUAL VOICE ASSESSMENT: Voice quality: hoarse, low vocal intensity, vocal fatigue, and pitch breaks Vocal abuse: slightly lower than average pitch and volume Resonance: normal Respiratory function: clavicular breathing  OBJECTIVE VOICE ASSESSMENT: Objective Voice Measurements I    Maximum phonation  time for sustained "ah" 6.5 sec    Average fundamental frequency during sustained "ah" 230 Hz  (average of 244 Hz +/- 27 for gender)     Habitual pitch 177 Hz    Highest dynamic pitch in conversational speech 275 Hz    Lowest dynamic pitch in conversational speech 87 Hz    Average dB in conversation 76 dB    /s/ :/z/ ratio(suggestive of dysfunction > 1.0) .98      ORAL MOTOR EXAMINATION Facial : WFL Lingual: WFL Velum: WFL Mandible: WFL Cough: Fair Voice: Hoarse, Weak   PATIENT REPORTED OUTCOME MEASURES (PROM):  VOICE HANDICAP INDEX  (VHI)  The Voice Handicap Index is comprised of a series of questions to assess the patient's perception of their voice. It is designed to evaluate the emotional, physical and functional components of the voice problem.  Functional: 7/40 Physical: 16/40 Emotional: 0/40 Total: 23/120 mild  T  PATIENT EDUCATION: Education details: role of SLP, results of assessment, tx objectives Person educated: Patient Education method: Explanation Education comprehension: verbalized understanding; needs reinforcement   HOME EXERCISE PROGRAM: To be given in upcoming sessions    GOALS: Goals reviewed with patient? Yes  SHORT TERM GOALS: Target date: 10 sessions  The patient will demonstrate abdominal breathing patterns and steady release of breath on exhalation to optimize efficiency of voicing and decrease laryngeal hyperfunction.   Baseline: Goal status: INITIAL  2.  Patient will ID x3 strategies to improve vocal quality, vocal hygiene, voice projection, and prevent vocal fatigue.    Baseline:  Goal status: INITIAL  3.  The patient will utilize a forward tone focused/resonant voice to decrease vocal hyperfunction and improve voice quality and vocal projection.  Baseline:  Goal status: INITIAL  4.  The patient will participate in 5-8 minutes conversation, maintaining average loudness of 75 dB and loud, good quality voice with min cues.      Baseline:  Goal status: INITIAL  5.  The patient will decrease laryngeal and articulatory muscle tension by independently completing relaxation/stretching exercises with min cueing.   Baseline:  Goal status: INITIAL   LONG TERM GOALS: Target date: 12 weeks  Patient will report improved communication effectiveness as measured by PROM  Baseline: VHI 23/120 Goal status: INITIAL  2.  The patient will participate in 15-20 minutes conversation, maintaining average loudness of 75 dB and loud, good quality voice.   Baseline:  Goal status:  INITIAL  3.  The patient will decrease laryngeal and articulatory muscle tension by independently completing relaxation/stretching exercises independently.  Baseline:  Goal status: INITIAL   ASSESSMENT:  CLINICAL IMPRESSION: Patient is a 57 y.o. female who was seen today for voice evaluation in setting of L vocal cord paralysis (confirmed by ENT in September 2024 via laryngoscopy). Patient presents with dysphonia characterized by perceptual features of hoarse, vocal quality, intermittently low vocal intensity and low pitch, and pitch breaks. Pt with tendency to utilize clavicular breathing with phonation/effort. Pt is motivated to improve vocal quality. I recommend skilled ST to improve vocal quality, endurance, and vocal hygiene to meet other vocal demands of work and home.   OBJECTIVE IMPAIRMENTS include voice disorder. These impairments are limiting patient from effectively communicating at home and in community. Factors affecting potential to achieve goals and functional outcome are medical prognosis (paralyzed vocal cord; further medical work up pending). Patient will benefit from skilled SLP services to address above impairments and improve overall function.  REHAB POTENTIAL: Good  PLAN: SLP FREQUENCY: 1-2x/week  SLP  DURATION: 12 weeks  PLANNED INTERVENTIONS: Environmental controls, Economist, Internal/external aids, Functional tasks, Multimodal communication approach, SLP instruction and feedback, Compensatory strategies, Patient/family education, and Re-evaluation    Clyde Canterbury, M.S., CCC-SLP Speech-Language Pathologist [place] [facility] [phone] Arnette Felts)  [facility] at [facility] [address] [place], [place], [address] Phone: [phone]   Fax:  [phone]

## 2023-07-08 ENCOUNTER — Ambulatory Visit: Payer: 59

## 2023-07-08 DIAGNOSIS — R490 Dysphonia: Secondary | ICD-10-CM

## 2023-07-08 NOTE — Therapy (Addendum)
OUTPATIENT SPEECH LANGUAGE PATHOLOGY  VOICE TREATMENT   Patient Name: Tara [name] MRN: [id number] DOB:[birth year], 57 y.o., female Today's Date: 07/08/2023  PCP: Dr. Charlton Haws  REFERRING PROVIDER: Dr. Linus Salmons   End of Session - 07/08/23 1551     Visit Number 2    Date for SLP Re-Evaluation 09/27/23    Progress Note Due on Visit 10    SLP Start Time 1445    SLP Stop Time  1540    SLP Time Calculation (min) 55 min    Activity Tolerance Patient tolerated treatment well             Past Medical History:  Diagnosis Date   Breast lump in female    rt side 10 oclock   Elevated BP    Endometriosis of intestine    Sigmoid colon polyp   Fibroid    h/o   Night sweats    Perimenopausal    Vaginal Pap smear, abnormal    ascus.pos   Wears contact lenses    Past Surgical History:  Procedure Laterality Date   ABDOMINAL HYSTERECTOMY  2008   lsh/rso   BREAST EXCISIONAL BIOPSY Right    benign   BREAST SURGERY Right    lumpectomy    COLONOSCOPY WITH PROPOFOL N/A 08/30/2015   Procedure: COLONOSCOPY WITH PROPOFOL;  Surgeon: Midge Minium, MD;  Location: [facility];  Service: Endoscopy;  Laterality: N/A;   OOPHORECTOMY     one ovary left   POLYPECTOMY  08/30/2015   Procedure: POLYPECTOMY;  Surgeon: Midge Minium, MD;  Location: [facility];  Service: Endoscopy;;   Patient Active Problem List   Diagnosis Date Noted   Fatigue 08/11/2018   BV (bacterial vaginosis) 12/22/2016   Special screening for malignant neoplasms, colon    Benign neoplasm of ascending colon    Benign neoplasm of transverse colon    Benign neoplasm of sigmoid colon    Elevated blood pressure 07/31/2015   History of hysterectomy, supracervical 07/31/2015    ONSET DATE:  referral date 06/16/23; late June 2024 onset per pt report   REFERRING DIAG: dysphonia  THERAPY DIAG:  Dysphonia  Rationale for Evaluation and Treatment Rehabilitation  SUBJECTIVE:   SUBJECTIVE  STATEMENT: Pt alert, pleasant, and cooperative. Pt accompanied by: self  PERTINENT HISTORY: 57 y.o. female referred by Dr. Jenne Campus for chief complaint of a sudden onset of mild, intermittent hoarseness which started in June 2024.   DIAGNOSTIC FINDINGS: Laryngoscopy on 06/15/23 noted, paramedian L vocal cord paralysis with "good compensation from the R cord." Results of Head CT, CT soft tissue neck, and CT chest on 07/01/23 pending.   PAIN:  Are you having pain? No   FALLS: Has patient fallen in last 6 months? No, Number of falls: 0  LIVING ENVIRONMENT: Lives with: lives alone   PLOF: Independent  PATIENT GOALS    to improve voice  OBJECTIVE:  Today's Treatment: Pt participated in stimulability training for resonant voice therapy, flow phonation therapy, and clear speech therapy. Pt stimulable for all voice tx techniques with marked improvement in vocal quality. Pt with fair-good awareness of how speech sounds and feels when strategies are utilized. Pt completed resonant voice therapy at the phrase and sentence level, min verbal cues needed. Marked difficult with voice onset after initial phonation. HEP provided for resonant voice therapy. Pt participated in 10 minutes of conversation utilizing clear speech with minimal hoarseness. Pt stating, "I sound like myself."   Education provided re: environmental  modifications to improve vocal quality and vocal   hygiene. Will continue to address in upcoming   sessions.     PATIENT EDUCATION: Education details: as above Person educated: Patient Education method: Explanation Education comprehension: verbalized understanding; needs reinforcement   HOME EXERCISE PROGRAM: Resonant voice therapy ex's    GOALS: Goals reviewed with patient? Yes  SHORT TERM GOALS: Target date: 10 sessions  The patient will demonstrate abdominal breathing patterns and steady release of breath on exhalation to optimize efficiency of voicing and decrease  laryngeal hyperfunction.   Baseline: Goal status: INITIAL  2.  Patient will ID x3 strategies to improve vocal quality, vocal hygiene, voice projection, and prevent vocal fatigue.    Baseline:  Goal status: INITIAL  3.  The patient will utilize a forward tone focused/resonant voice to decrease vocal hyperfunction and improve voice quality and vocal projection.  Baseline:  Goal status: INITIAL  4.  The patient will participate in 5-8 minutes conversation, maintaining average loudness of 75 dB and loud, good quality voice with min cues.      Baseline:  Goal status: INITIAL  5.  The patient will decrease laryngeal and articulatory muscle tension by independently completing relaxation/stretching exercises with min cueing.   Baseline:  Goal status: INITIAL   LONG TERM GOALS: Target date: 12 weeks  Patient will report improved communication effectiveness as measured by PROM  Baseline: VHI 23/120 Goal status: INITIAL  2.  The patient will participate in 15-20 minutes conversation, maintaining average loudness of 75 dB and loud, good quality voice.   Baseline:  Goal status: INITIAL  3.  The patient will decrease laryngeal and articulatory muscle tension by independently completing relaxation/stretching exercises independently.  Baseline:  Goal status: INITIAL   ASSESSMENT:  CLINICAL IMPRESSION: Patient is a 57 y.o. female who was seen today for voice evaluation in setting of L vocal cord paralysis (confirmed by ENT in September 2024 via laryngoscopy). Patient presents with dysphonia characterized by perceptual features of hoarse, vocal quality, intermittently low vocal intensity and low pitch, and pitch breaks. Pt with tendency to utilize clavicular breathing with phonation/effort. Pt is motivated to improve vocal quality. See above for details of tx session. I recommend skilled ST to improve vocal quality, endurance, and vocal hygiene to meet other vocal demands of work and home.    OBJECTIVE IMPAIRMENTS include voice disorder. These impairments are limiting patient from effectively communicating at home and in community. Factors affecting potential to achieve goals and functional outcome are medical prognosis (paralyzed vocal cord; further medical work up pending). Patient will benefit from skilled SLP services to address above impairments and improve overall function.  REHAB POTENTIAL: Good  PLAN: SLP FREQUENCY: 1-2x/week  SLP DURATION: 12 weeks  PLANNED INTERVENTIONS: Environmental controls, Cueing hierachy, Internal/external aids, Functional tasks, Multimodal communication approach, SLP instruction and feedback, Compensatory strategies, Patient/family education, and Re-evaluation    Clyde Canterbury, M.S., CCC-SLP Speech-Language Pathologist [place] [facility] [phone] Arnette Felts)  [facility] Out[facility] Rehabilitation at [facility] [address] [place], [place], [address] Phone: [phone]   Fax:  [phone]

## 2023-07-12 ENCOUNTER — Ambulatory Visit: Payer: 59

## 2023-07-12 DIAGNOSIS — R490 Dysphonia: Secondary | ICD-10-CM | POA: Diagnosis not present

## 2023-07-12 NOTE — Therapy (Addendum)
OUTPATIENT SPEECH LANGUAGE PATHOLOGY  VOICE TREATMENT   Patient Name: Tara [name] MRN: [id number] DOB:[birth year], 57 y.o., female Today's Date: 07/12/2023  PCP: Dr. Charlton Haws  REFERRING PROVIDER: Dr. Linus Salmons   End of Session - 07/12/23 1153     Visit Number 3    Number of Visits 24    Date for SLP Re-Evaluation 09/27/23    Progress Note Due on Visit 10    SLP Start Time 1100    SLP Stop Time  1150    SLP Time Calculation (min) 50 min    Activity Tolerance Patient tolerated treatment well             Past Medical History:  Diagnosis Date   Breast lump in female    rt side 10 oclock   Elevated BP    Endometriosis of intestine    Sigmoid colon polyp   Fibroid    h/o   Night sweats    Perimenopausal    Vaginal Pap smear, abnormal    ascus.pos   Wears contact lenses    Past Surgical History:  Procedure Laterality Date   ABDOMINAL HYSTERECTOMY  2008   lsh/rso   BREAST EXCISIONAL BIOPSY Right    benign   BREAST SURGERY Right    lumpectomy    COLONOSCOPY WITH PROPOFOL N/A 08/30/2015   Procedure: COLONOSCOPY WITH PROPOFOL;  Surgeon: Midge Minium, MD;  Location: [facility];  Service: Endoscopy;  Laterality: N/A;   OOPHORECTOMY     one ovary left   POLYPECTOMY  08/30/2015   Procedure: POLYPECTOMY;  Surgeon: Midge Minium, MD;  Location: [facility];  Service: Endoscopy;;   Patient Active Problem List   Diagnosis Date Noted   Fatigue 08/11/2018   BV (bacterial vaginosis) 12/22/2016   Special screening for malignant neoplasms, colon    Benign neoplasm of ascending colon    Benign neoplasm of transverse colon    Benign neoplasm of sigmoid colon    Elevated blood pressure 07/31/2015   History of hysterectomy, supracervical 07/31/2015    ONSET DATE:  referral date 06/16/23; late June 2024 onset per pt report   REFERRING DIAG: dysphonia  THERAPY DIAG:  Dysphonia  Rationale for Evaluation and Treatment Rehabilitation  SUBJECTIVE:    SUBJECTIVE STATEMENT: Pt alert, pleasant, and cooperative. "My voice is gone today." Pt endorsed taking Zyrtec for allergies and not drinking much water this AM.   Pt accompanied by: self  PERTINENT HISTORY: 57 y.o. female referred by Dr. Jenne Campus for chief complaint of a sudden onset of mild, intermittent hoarseness which started in June 2024.   DIAGNOSTIC FINDINGS: Laryngoscopy on 06/15/23 noted, paramedian L vocal cord paralysis with "good compensation from the R cord." Results of Head CT, CT soft tissue neck, and CT chest on 07/01/23 pending.   PAIN:  Are you having pain? No   FALLS: Has patient fallen in last 6 months? No, Number of falls: 0  LIVING ENVIRONMENT: Lives with: lives alone   PLOF: Independent  PATIENT GOALS    to improve voice  OBJECTIVE:  Today's Treatment: Pt participated in skilled SLP services targeting dysphonia. Introduced neck, tongue, and throat stretches as well as diaphragmatic breathing ex's for pt to complete via HEP. Initial rare-min verbal/visual cues needed to perform ex's correctly, improving to modified independence (pt referring to handout). Pt completed resonant voice therapy at the word, phrase, and sentence level. Min verbal cues needed. Marked difficult with voice onset after initial phonation as well as increased  vocal hoarseness with lower intensity. During structured and unstructured conversation, mild-moderate hoarseness appreciated. Pt benefited from cues to utilize clear speech (e.g. slow, loud, overarticulate)    Noted pt with occasional throat clearing. Educated provided re: phonotraumatic     nature of throat clearing, coughing, and yelling. Offered replacement behavior of    swallow and exhalation for throat clearing. Reviewed vocal hygiene including   adequate hydration and vocal naps. Will continue to address in upcoming sessions.    PATIENT EDUCATION: Education details: as above Person educated: Patient Education method:  Explanation Education comprehension: verbalized understanding; needs reinforcement   HOME EXERCISE PROGRAM: Resonant voice therapy ex's, breathing ex's, and stretches    GOALS: Goals reviewed with patient? Yes  SHORT TERM GOALS: Target date: 10 sessions  The patient will demonstrate abdominal breathing patterns and steady release of breath on exhalation to optimize efficiency of voicing and decrease laryngeal hyperfunction.   Baseline: Goal status: INITIAL  2.  Patient will ID x3 strategies to improve vocal quality, vocal hygiene, voice projection, and prevent vocal fatigue.    Baseline:  Goal status: INITIAL  3.  The patient will utilize a forward tone focused/resonant voice to decrease vocal hyperfunction and improve voice quality and vocal projection.  Baseline:  Goal status: INITIAL  4.  The patient will participate in 5-8 minutes conversation, maintaining average loudness of 75 dB and loud, good quality voice with min cues.      Baseline:  Goal status: INITIAL  5.  The patient will decrease laryngeal and articulatory muscle tension by independently completing relaxation/stretching exercises with min cueing.   Baseline:  Goal status: INITIAL   LONG TERM GOALS: Target date: 12 weeks  Patient will report improved communication effectiveness as measured by PROM  Baseline: VHI 23/120 Goal status: INITIAL  2.  The patient will participate in 15-20 minutes conversation, maintaining average loudness of 75 dB and loud, good quality voice.   Baseline:  Goal status: INITIAL  3.  The patient will decrease laryngeal and articulatory muscle tension by independently completing relaxation/stretching exercises independently.  Baseline:  Goal status: INITIAL   ASSESSMENT:  CLINICAL IMPRESSION: Patient is a 57 y.o. female who was seen today for voice evaluation in setting of L vocal cord paralysis (confirmed by ENT in September 2024 via laryngoscopy). Patient presents with  dysphonia characterized by perceptual features of hoarse, vocal quality, intermittently low vocal intensity and low pitch, and pitch breaks. Pt with tendency to utilize clavicular breathing with phonation/effort. Pt is motivated to improve vocal quality. See above for details of tx session. I recommend skilled ST to improve vocal quality, endurance, and vocal hygiene to meet other vocal demands of work and home.   OBJECTIVE IMPAIRMENTS include voice disorder. These impairments are limiting patient from effectively communicating at home and in community. Factors affecting potential to achieve goals and functional outcome are medical prognosis (paralyzed vocal cord; further medical work up pending). Patient will benefit from skilled SLP services to address above impairments and improve overall function.  REHAB POTENTIAL: Good  PLAN: SLP FREQUENCY: 1-2x/week  SLP DURATION: 12 weeks  PLANNED INTERVENTIONS: Environmental controls, Cueing hierachy, Internal/external aids, Functional tasks, Multimodal communication approach, SLP instruction and feedback, Compensatory strategies, Patient/family education, and Re-evaluation    Clyde Canterbury, M.S., CCC-SLP Speech-Language Pathologist [place] [facility] [phone] Arnette Felts)   [facility] at [facility] [address] [place], [place], [address] Phone: [phone]   Fax:  [phone]

## 2023-07-15 ENCOUNTER — Ambulatory Visit: Payer: 59

## 2023-07-15 DIAGNOSIS — R490 Dysphonia: Secondary | ICD-10-CM | POA: Diagnosis not present

## 2023-07-15 NOTE — Therapy (Addendum)
OUTPATIENT SPEECH LANGUAGE PATHOLOGY  VOICE TREATMENT   Patient Name: DACI ARNT MRN: [id number] DOB:1965/11/04, 57 y.o., female Today's Date: 07/15/2023  PCP: Dr. Charlton Haws  REFERRING PROVIDER: Dr. Linus Salmons   End of Session - 07/15/23 1622     Visit Number 4    Number of Visits 24    Date for SLP Re-Evaluation 09/27/23    Progress Note Due on Visit 10    SLP Start Time 1445    SLP Stop Time  1530    SLP Time Calculation (min) 45 min    Activity Tolerance Patient tolerated treatment well             Past Medical History:  Diagnosis Date   Breast lump in female    rt side 10 oclock   Elevated BP    Endometriosis of intestine    Sigmoid colon polyp   Fibroid    h/o   Night sweats    Perimenopausal    Vaginal Pap smear, abnormal    ascus.pos   Wears contact lenses    Past Surgical History:  Procedure Laterality Date   ABDOMINAL HYSTERECTOMY  2008   lsh/rso   BREAST EXCISIONAL BIOPSY Right    benign   BREAST SURGERY Right    lumpectomy    COLONOSCOPY WITH PROPOFOL N/A 08/30/2015   Procedure: COLONOSCOPY WITH PROPOFOL;  Surgeon: Midge Minium, MD;  Location: [facility];  Service: Endoscopy;  Laterality: N/A;   OOPHORECTOMY     one ovary left   POLYPECTOMY  08/30/2015   Procedure: POLYPECTOMY;  Surgeon: Midge Minium, MD;  Location: [facility];  Service: Endoscopy;;   Patient Active Problem List   Diagnosis Date Noted   Fatigue 08/11/2018   BV (bacterial vaginosis) 12/22/2016   Special screening for malignant neoplasms, colon    Benign neoplasm of ascending colon    Benign neoplasm of transverse colon    Benign neoplasm of sigmoid colon    Elevated blood pressure 07/31/2015   History of hysterectomy, supracervical 07/31/2015    ONSET DATE:  referral date 06/16/23; late June 2024 onset per pt report   REFERRING DIAG: dysphonia  THERAPY DIAG:  Dysphonia  Rationale for Evaluation and Treatment Rehabilitation  SUBJECTIVE:    SUBJECTIVE STATEMENT: Pt alert, pleasant, and cooperative. "My voice is gone today." Pt endorsed taking Zyrtec for allergies and not drinking much water this AM.   Pt accompanied by: self  PERTINENT HISTORY: 57 y.o. female referred by Dr. Jenne Campus for chief complaint of a sudden onset of mild, intermittent hoarseness which started in June 2024.   DIAGNOSTIC FINDINGS: Laryngoscopy on 06/15/23 noted, paramedian L vocal cord paralysis with "good compensation from the R cord." Results of Head CT, CT soft tissue neck, and CT chest on 07/01/23 pending.   PAIN:  Are you having pain? No   FALLS: Has patient fallen in last 6 months? No, Number of falls: 0  LIVING ENVIRONMENT: Lives with: lives alone   PLOF: Independent  PATIENT GOALS    to improve voice  OBJECTIVE:  Today's Treatment: Pt participated in skilled SLP services targeting dysphonia. Reviewed neck, tongue, and throat stretches as well as diaphragmatic breathing ex's. Pt endorsed completing via HEP. Pt completed resonant voice therapy at the word, phrase, and sentence level. Min verbal cues needed. Marked difficult with voice onset after initial phonation as well as increased vocal hoarseness with lower intensity. During structured and unstructured conversation, mild-moderate hoarseness appreciated. Pt benefited from cues to  utilize clear speech (e.g. slow, loud, overarticulate)    Briefly reviewed vocal hygiene. Pt endorsed continuing    with adequate water intake, reduction in caffeinated   beverages, and taking "vocal naps." Suggested pt   consider use of a humidifier overnight. Will continue to   address in upcoming sessions.    PATIENT EDUCATION: Education details: as above Person educated: Patient Education method: Explanation Education comprehension: verbalized understanding; needs reinforcement   HOME EXERCISE PROGRAM: Resonant voice therapy ex's, breathing ex's, and stretches    GOALS: Goals reviewed with  patient? Yes  SHORT TERM GOALS: Target date: 10 sessions  The patient will demonstrate abdominal breathing patterns and steady release of breath on exhalation to optimize efficiency of voicing and decrease laryngeal hyperfunction.   Baseline: Goal status: INITIAL  2.  Patient will ID x3 strategies to improve vocal quality, vocal hygiene, voice projection, and prevent vocal fatigue.    Baseline:  Goal status: INITIAL  3.  The patient will utilize a forward tone focused/resonant voice to decrease vocal hyperfunction and improve voice quality and vocal projection.  Baseline:  Goal status: INITIAL  4.  The patient will participate in 5-8 minutes conversation, maintaining average loudness of 75 dB and loud, good quality voice with min cues.      Baseline:  Goal status: INITIAL  5.  The patient will decrease laryngeal and articulatory muscle tension by independently completing relaxation/stretching exercises with min cueing.   Baseline:  Goal status: INITIAL   LONG TERM GOALS: Target date: 12 weeks  Patient will report improved communication effectiveness as measured by PROM  Baseline: VHI 23/120 Goal status: INITIAL  2.  The patient will participate in 15-20 minutes conversation, maintaining average loudness of 75 dB and loud, good quality voice.   Baseline:  Goal status: INITIAL  3.  The patient will decrease laryngeal and articulatory muscle tension by independently completing relaxation/stretching exercises independently.  Baseline:  Goal status: INITIAL   ASSESSMENT:  CLINICAL IMPRESSION: Patient is a 57 y.o. female who was seen today for voice evaluation in setting of L vocal cord paralysis (confirmed by ENT in September 2024 via laryngoscopy). Patient presents with dysphonia characterized by perceptual features of hoarse, vocal quality, intermittently low vocal intensity and low pitch, and pitch breaks. Pt with tendency to utilize clavicular breathing with  phonation/effort. Pt is motivated to improve vocal quality. See above for details of tx session. I recommend skilled ST to improve vocal quality, endurance, and vocal hygiene to meet other vocal demands of work and home.   OBJECTIVE IMPAIRMENTS include voice disorder. These impairments are limiting patient from effectively communicating at home and in community. Factors affecting potential to achieve goals and functional outcome are medical prognosis (paralyzed vocal cord; further medical work up pending). Patient will benefit from skilled SLP services to address above impairments and improve overall function.  REHAB POTENTIAL: Good  PLAN: SLP FREQUENCY: 1-2x/week  SLP DURATION: 12 weeks  PLANNED INTERVENTIONS: Environmental controls, Cueing hierachy, Internal/external aids, Functional tasks, Multimodal communication approach, SLP instruction and feedback, Compensatory strategies, Patient/family education, and Re-evaluation    Clyde Canterbury, M.S., CCC-SLP Speech-Language Pathologist [place] [facility] [phone] Arnette Felts)  [place] [facility] Outpatient Rehabilitation at [facility] [address] [place], [place], [address] Phone: [phone]   Fax:  [phone]

## 2023-07-19 ENCOUNTER — Ambulatory Visit: Payer: 59

## 2023-07-19 DIAGNOSIS — R490 Dysphonia: Secondary | ICD-10-CM | POA: Diagnosis not present

## 2023-07-19 NOTE — Therapy (Addendum)
OUTPATIENT SPEECH LANGUAGE PATHOLOGY  VOICE TREATMENT   Patient Name: Tara [name] MRN: [id number] DOB:12/23/1965, 57 y.o., female Today's Date: 07/19/2023  PCP: Dr. Charlton Haws  REFERRING PROVIDER: Dr. Linus Salmons   End of Session - 07/19/23 1054     Visit Number 5    Number of Visits 24    Date for SLP Re-Evaluation 09/27/23    SLP Start Time 1055    SLP Stop Time  1145    SLP Time Calculation (min) 50 min    Activity Tolerance Patient tolerated treatment well             Past Medical History:  Diagnosis Date   Breast lump in female    rt side 10 oclock   Elevated BP    Endometriosis of intestine    Sigmoid colon polyp   Fibroid    h/o   Night sweats    Perimenopausal    Vaginal Pap smear, abnormal    ascus.pos   Wears contact lenses    Past Surgical History:  Procedure Laterality Date   ABDOMINAL HYSTERECTOMY  2008   lsh/rso   BREAST EXCISIONAL BIOPSY Right    benign   BREAST SURGERY Right    lumpectomy    COLONOSCOPY WITH PROPOFOL N/A 08/30/2015   Procedure: COLONOSCOPY WITH PROPOFOL;  Surgeon: Midge Minium, MD;  Location: [facility];  Service: Endoscopy;  Laterality: N/A;   OOPHORECTOMY     one ovary left   POLYPECTOMY  08/30/2015   Procedure: POLYPECTOMY;  Surgeon: Midge Minium, MD;  Location: [facility];  Service: Endoscopy;;   Patient Active Problem List   Diagnosis Date Noted   Fatigue 08/11/2018   BV (bacterial vaginosis) 12/22/2016   Special screening for malignant neoplasms, colon    Benign neoplasm of ascending colon    Benign neoplasm of transverse colon    Benign neoplasm of sigmoid colon    Elevated blood pressure 07/31/2015   History of hysterectomy, supracervical 07/31/2015    ONSET DATE:  referral date 06/16/23; late June 2024 onset per pt report   REFERRING DIAG: dysphonia  THERAPY DIAG:  Dysphonia  Rationale for Evaluation and Treatment Rehabilitation  SUBJECTIVE:   SUBJECTIVE STATEMENT: Pt  alert, pleasant, and cooperative. "I had a great day on Saturday" (regarding voice)  Pt accompanied by: self  PERTINENT HISTORY: 57 y.o. female referred by Dr. Jenne Campus for chief complaint of a sudden onset of mild, intermittent hoarseness which started in June 2024.   DIAGNOSTIC FINDINGS: Laryngoscopy on 06/15/23 noted, paramedian L vocal cord paralysis with "good compensation from the R cord." Results of Head CT, CT soft tissue neck, and CT chest on 07/01/23 pending.   PAIN:  Are you having pain? No   FALLS: Has patient fallen in last 6 months? No, Number of falls: 0  LIVING ENVIRONMENT: Lives with: lives alone   PLOF: Independent  PATIENT GOALS    to improve voice  OBJECTIVE:  Today's Treatment: Pt participated in skilled SLP services targeting dysphonia. Pt performed neck, tongue, and throat stretches as well as diaphragmatic breathing ex's. Pt endorsed completing via HEP. Pt completed clear speech therapy at the sentence level. Rare verbal cues needed. Marked difficult with voice onset after initial phonation as well as increased vocal hoarseness with lower intensity. Improved with cues to pretend like pt and SLP were "sitting at a distance." During structured and unstructured conversation, mild-moderate hoarseness appreciated which improve greatly over the course of tx session. Pt benefited from  cues to utilize clear speech (e.g. slow, loud, overarticulate).  Pt with complaints of globus sensation this morning. Pt without hx of GERD/LPR, but endorsed throat clearing and sinus drainage. Pt completed Reflux Symptom Index and scored 9/45 (>13 is abnormal). Pt encouraged to notify PCP and/or ENT if symptoms worsen.   PATIENT EDUCATION: Education details: as above Person educated: Patient Education method: Explanation Education comprehension: verbalized understanding; needs reinforcement   HOME EXERCISE PROGRAM: Resonant voice therapy ex's, breathing ex's, and  stretches    GOALS: Goals reviewed with patient? Yes  SHORT TERM GOALS: Target date: 10 sessions  The patient will demonstrate abdominal breathing patterns and steady release of breath on exhalation to optimize efficiency of voicing and decrease laryngeal hyperfunction.   Baseline: Goal status: INITIAL  2.  Patient will ID x3 strategies to improve vocal quality, vocal hygiene, voice projection, and prevent vocal fatigue.    Baseline:  Goal status: INITIAL  3.  The patient will utilize a forward tone focused/resonant voice to decrease vocal hyperfunction and improve voice quality and vocal projection.  Baseline:  Goal status: INITIAL  4.  The patient will participate in 5-8 minutes conversation, maintaining average loudness of 75 dB and loud, good quality voice with min cues.      Baseline:  Goal status: INITIAL  5.  The patient will decrease laryngeal and articulatory muscle tension by independently completing relaxation/stretching exercises with min cueing.   Baseline:  Goal status: INITIAL   LONG TERM GOALS: Target date: 12 weeks  Patient will report improved communication effectiveness as measured by PROM  Baseline: VHI 23/120 Goal status: INITIAL  2.  The patient will participate in 15-20 minutes conversation, maintaining average loudness of 75 dB and loud, good quality voice.   Baseline:  Goal status: INITIAL  3.  The patient will decrease laryngeal and articulatory muscle tension by independently completing relaxation/stretching exercises independently.  Baseline:  Goal status: INITIAL   ASSESSMENT:  CLINICAL IMPRESSION: Patient is a 57 y.o. female who was seen today for voice evaluation in setting of L vocal cord paralysis (confirmed by ENT in September 2024 via laryngoscopy). Patient presents with dysphonia characterized by perceptual features of hoarse, vocal quality, intermittently low vocal intensity and low pitch, and pitch breaks. Pt with tendency to  utilize clavicular breathing with phonation/effort. Pt is motivated to improve vocal quality. See above for details of tx session. I recommend skilled ST to improve vocal quality, endurance, and vocal hygiene to meet other vocal demands of work and home.   OBJECTIVE IMPAIRMENTS include voice disorder. These impairments are limiting patient from effectively communicating at home and in community. Factors affecting potential to achieve goals and functional outcome are medical prognosis (paralyzed vocal cord; further medical work up pending). Patient will benefit from skilled SLP services to address above impairments and improve overall function.  REHAB POTENTIAL: Good  PLAN: SLP FREQUENCY: 1-2x/week  SLP DURATION: 12 weeks  PLANNED INTERVENTIONS: Environmental controls, Cueing hierachy, Internal/external aids, Functional tasks, Multimodal communication approach, SLP instruction and feedback, Compensatory strategies, Patient/family education, and Re-evaluation    Clyde Canterbury, M.S., CCC-SLP Speech-Language Pathologist [place] [facility] [phone] Arnette Felts)  [place] [facility] Outpatient Rehabilitation at [facility] [address] [place], [place], [address] Phone: [phone]   Fax:  [phone]

## 2023-07-22 ENCOUNTER — Ambulatory Visit: Payer: 59

## 2023-07-22 DIAGNOSIS — R490 Dysphonia: Secondary | ICD-10-CM | POA: Diagnosis not present

## 2023-07-22 MED ORDER — IOPAMIDOL (ISOVUE-300) INJECTION 61%
75.0000 mL | Freq: Once | INTRAVENOUS | Status: AC | PRN
  Administered 2023-07-22: 75 mL via INTRAVENOUS

## 2023-07-22 NOTE — Therapy (Signed)
OUTPATIENT SPEECH LANGUAGE PATHOLOGY  VOICE TREATMENT   Patient Name: Tara [name] MRN: [id number] DOB:[birth year], 57 y.o., female Today's Date: 07/22/2023  PCP: Dr. Charlton Haws  REFERRING PROVIDER: Dr. Linus Salmons   End of Session - 07/22/23 1545     Visit Number 6    Number of Visits 24    Date for SLP Re-Evaluation 09/27/23    Progress Note Due on Visit 10    SLP Start Time 1450    SLP Stop Time  1540    SLP Time Calculation (min) 50 min    Activity Tolerance Patient tolerated treatment well             Past Medical History:  Diagnosis Date   Breast lump in female    rt side 10 oclock   Elevated BP    Endometriosis of intestine    Sigmoid colon polyp   Fibroid    h/o   Night sweats    Perimenopausal    Vaginal Pap smear, abnormal    ascus.pos   Wears contact lenses    Past Surgical History:  Procedure Laterality Date   ABDOMINAL HYSTERECTOMY  2008   lsh/rso   BREAST EXCISIONAL BIOPSY Right    benign   BREAST SURGERY Right    lumpectomy    COLONOSCOPY WITH PROPOFOL N/A 08/30/2015   Procedure: COLONOSCOPY WITH PROPOFOL;  Surgeon: Midge Minium, MD;  Location: [facility];  Service: Endoscopy;  Laterality: N/A;   OOPHORECTOMY     one ovary left   POLYPECTOMY  08/30/2015   Procedure: POLYPECTOMY;  Surgeon: Midge Minium, MD;  Location: [facility];  Service: Endoscopy;;   Patient Active Problem List   Diagnosis Date Noted   Fatigue 08/11/2018   BV (bacterial vaginosis) 12/22/2016   Special screening for malignant neoplasms, colon    Benign neoplasm of ascending colon    Benign neoplasm of transverse colon    Benign neoplasm of sigmoid colon    Elevated blood pressure 07/31/2015   History of hysterectomy, supracervical 07/31/2015    ONSET DATE:  referral date 06/16/23; late June 2024 onset per pt report   REFERRING DIAG: dysphonia  THERAPY DIAG:  Dysphonia  Rationale for Evaluation and Treatment Rehabilitation  SUBJECTIVE:    SUBJECTIVE STATEMENT: Pt alert, pleasant, and cooperative. Endorsed voice being "clear" on Monday afternoon. Pt also noted having repeat CT this date.  Pt accompanied by: self  PERTINENT HISTORY: 57 y.o. female referred by Dr. Jenne Campus for chief complaint of a sudden onset of mild, intermittent hoarseness which started in June 2024.   DIAGNOSTIC FINDINGS: Laryngoscopy on 06/15/23 noted, paramedian L vocal cord paralysis with "good compensation from the R cord." Results of Head CT, CT soft tissue neck, and CT chest on 07/01/23 pending.   PAIN:  Are you having pain? No   FALLS: Has patient fallen in last 6 months? No, Number of falls: 0  LIVING ENVIRONMENT: Lives with: lives alone   PLOF: Independent  PATIENT GOALS    to improve voice  OBJECTIVE:  Today's Treatment: Pt participated in skilled SLP services targeting dysphonia. Pt performed neck, tongue, and throat stretches as well as diaphragmatic breathing ex's. Pt endorsed completing via HEP. Introduced Microbiologist. SLP completed and pt returned demonstration with initial improvement in vocal quality; however, pt reported increased "drainage" resulting in increased throat clearing/hoarseness following circumlarnygeal massage. Pt completed clear speech therapy at the sentence level. Min verbal cues needed. Marked difficulty with voice onset after initial phonation  as well as increased vocal hoarseness with lower intensity. Improved with cues to pretend like pt and SLP were "sitting at a distance." During structured and unstructured conversation, mild-moderate hoarseness appreciated which improve greatly over the course of tx session. Pt benefited from cues to utilize clear speech (e.g. slow, loud, overarticulate).  Pt with reports of increased "drainage" before and during tx. Pt stated she would resume taking her nasal spray and Zyrtec for allergies as per her MD guidance.   PATIENT EDUCATION: Education details: as  above Person educated: Patient Education method: Explanation Education comprehension: verbalized understanding; needs reinforcement   HOME EXERCISE PROGRAM: Resonant voice therapy ex's, breathing ex's, circumlaryngeal massage, and stretches    GOALS: Goals reviewed with patient? Yes  SHORT TERM GOALS: Target date: 10 sessions  The patient will demonstrate abdominal breathing patterns and steady release of breath on exhalation to optimize efficiency of voicing and decrease laryngeal hyperfunction.   Baseline: Goal status: INITIAL  2.  Patient will ID x3 strategies to improve vocal quality, vocal hygiene, voice projection, and prevent vocal fatigue.    Baseline:  Goal status: INITIAL  3.  The patient will utilize a forward tone focused/resonant voice to decrease vocal hyperfunction and improve voice quality and vocal projection.  Baseline:  Goal status: INITIAL  4.  The patient will participate in 5-8 minutes conversation, maintaining average loudness of 75 dB and loud, good quality voice with min cues.      Baseline:  Goal status: INITIAL  5.  The patient will decrease laryngeal and articulatory muscle tension by independently completing relaxation/stretching exercises with min cueing.   Baseline:  Goal status: INITIAL   LONG TERM GOALS: Target date: 12 weeks  Patient will report improved communication effectiveness as measured by PROM  Baseline: VHI 23/120 Goal status: INITIAL  2.  The patient will participate in 15-20 minutes conversation, maintaining average loudness of 75 dB and loud, good quality voice.   Baseline:  Goal status: INITIAL  3.  The patient will decrease laryngeal and articulatory muscle tension by independently completing relaxation/stretching exercises independently.  Baseline:  Goal status: INITIAL   ASSESSMENT:  CLINICAL IMPRESSION: Patient is a 57 y.o. female who was seen today for voice evaluation in setting of L vocal cord paralysis  (confirmed by ENT in September 2024 via laryngoscopy). Patient presents with dysphonia characterized by perceptual features of hoarse, vocal quality, intermittently low vocal intensity and low pitch, and pitch breaks. Pt with tendency to utilize clavicular breathing with phonation/effort. Pt is motivated to improve vocal quality. See above for details of tx session. I recommend skilled ST to improve vocal quality, endurance, and vocal hygiene to meet other vocal demands of work and home.   OBJECTIVE IMPAIRMENTS include voice disorder. These impairments are limiting patient from effectively communicating at home and in community. Factors affecting potential to achieve goals and functional outcome are medical prognosis (paralyzed vocal cord; further medical work up pending). Patient will benefit from skilled SLP services to address above impairments and improve overall function.  REHAB POTENTIAL: Good  PLAN: SLP FREQUENCY: 1-2x/week  SLP DURATION: 12 weeks  PLANNED INTERVENTIONS: Environmental controls, Cueing hierachy, Internal/external aids, Functional tasks, Multimodal communication approach, SLP instruction and feedback, Compensatory strategies, Patient/family education, and Re-evaluation    Clyde Canterbury, M.S., CCC-SLP Speech-Language Pathologist [place] [facility] [phone] Arnette Felts)  [facility] at [facility] [address] [place], [place], [address] Phone: [phone]   Fax:  [phone]

## 2023-07-26 ENCOUNTER — Ambulatory Visit: Payer: 59

## 2023-07-26 DIAGNOSIS — R490 Dysphonia: Secondary | ICD-10-CM

## 2023-07-26 NOTE — Therapy (Signed)
OUTPATIENT SPEECH LANGUAGE PATHOLOGY  VOICE TREATMENT   Patient Name: Tara [name] MRN: [id number] DOB:09/14/1966, 57 y.o., female Today's Date: 07/26/2023  PCP: Dr. Charlton Haws  REFERRING PROVIDER: Dr. Linus Salmons   End of Session - 07/26/23 1206     Visit Number 7    Number of Visits 24    Date for SLP Re-Evaluation 09/27/23    Progress Note Due on Visit 10    SLP Start Time 1100    SLP Stop Time  1155    SLP Time Calculation (min) 55 min    Activity Tolerance Patient tolerated treatment well             Past Medical History:  Diagnosis Date   Breast lump in female    rt side 10 oclock   Elevated BP    Endometriosis of intestine    Sigmoid colon polyp   Fibroid    h/o   Night sweats    Perimenopausal    Vaginal Pap smear, abnormal    ascus.pos   Wears contact lenses    Past Surgical History:  Procedure Laterality Date   ABDOMINAL HYSTERECTOMY  2008   lsh/rso   BREAST EXCISIONAL BIOPSY Right    benign   BREAST SURGERY Right    lumpectomy    COLONOSCOPY WITH PROPOFOL N/A 08/30/2015   Procedure: COLONOSCOPY WITH PROPOFOL;  Surgeon: Midge Minium, MD;  Location: [facility];  Service: Endoscopy;  Laterality: N/A;   OOPHORECTOMY     one ovary left   POLYPECTOMY  08/30/2015   Procedure: POLYPECTOMY;  Surgeon: Midge Minium, MD;  Location: [facility];  Service: Endoscopy;;   Patient Active Problem List   Diagnosis Date Noted   Fatigue 08/11/2018   BV (bacterial vaginosis) 12/22/2016   Special screening for malignant neoplasms, colon    Benign neoplasm of ascending colon    Benign neoplasm of transverse colon    Benign neoplasm of sigmoid colon    Elevated blood pressure 07/31/2015   History of hysterectomy, supracervical 07/31/2015    ONSET DATE:  referral date 06/16/23; late June 2024 onset per pt report   REFERRING DIAG: dysphonia  THERAPY DIAG:  Dysphonia  Rationale for Evaluation and Treatment Rehabilitation  SUBJECTIVE:    SUBJECTIVE STATEMENT: Pt alert, pleasant, and cooperative.   Pt accompanied by: self  PERTINENT HISTORY: 57 y.o. female referred by Dr. Jenne Campus for chief complaint of a sudden onset of mild, intermittent hoarseness which started in June 2024.   DIAGNOSTIC FINDINGS: Laryngoscopy on 06/15/23 noted, paramedian L vocal cord paralysis with "good compensation from the R cord." CT chest, 07/22/23, "1. Numerous enlarged mediastinal lymph nodes. 2. Multiple small bilateral pulmonary nodules. 3. Bandlike scarring and volume loss of the right middle lobe. 4. Findings are concerning for malignancy or metastatic disease, however could reflect benign infectious or inflammatory etiology such as granulomatous infection or sarcoidosis. PET-CT may be helpful to assess for abnormal metabolic activity. Consider tissue sampling. 5. Cardiomegaly." CT soft tissue neck, 07/22/23, "1. Findings compatible with left vocal cord paralysis. No dominant neck mass. 2. Mildly enlarged left supraclavicular lymph nodes, indeterminate. Please see the separate chest CT report for thoracic findings and additional recommendations." Head CT, 07/22/23, "negative head CT."  PAIN:  Are you having pain? No   FALLS: Has patient fallen in last 6 months? No, Number of falls: 0  LIVING ENVIRONMENT: Lives with: lives alone   PLOF: Independent  PATIENT GOALS    to improve voice  OBJECTIVE:  Today's Treatment: Pt participated in skilled SLP services targeting dysphonia. Pt performed neck, tongue, and throat stretches as well as diaphragmatic breathing ex's. Pt endorsed completing via HEP. Pt completed resonant voice therapy (hum, hum-vowels, hum words) with rate cues. Pt completed clear speech therapy at the sentence level. Rare verbal cues needed. Marked difficult with voice onset after initial phonation as well as increased vocal hoarseness with lower intensity. Improved with cues for improve posture and breath support.  During structured and unstructured conversation, mild-moderate hoarseness appreciated which improve greatly over the course of tx session. Pt benefited from cues to utilize clear speech (e.g. slow, loud, overarticulate).   PATIENT EDUCATION: Education details: as above Person educated: Patient Education method: Explanation Education comprehension: verbalized understanding; needs reinforcement   HOME EXERCISE PROGRAM: Resonant voice therapy ex's, breathing ex's, and stretches    GOALS: Goals reviewed with patient? Yes  SHORT TERM GOALS: Target date: 10 sessions  The patient will demonstrate abdominal breathing patterns and steady release of breath on exhalation to optimize efficiency of voicing and decrease laryngeal hyperfunction.   Baseline: Goal status: INITIAL  2.  Patient will ID x3 strategies to improve vocal quality, vocal hygiene, voice projection, and prevent vocal fatigue.    Baseline:  Goal status: INITIAL  3.  The patient will utilize a forward tone focused/resonant voice to decrease vocal hyperfunction and improve voice quality and vocal projection.  Baseline:  Goal status: INITIAL  4.  The patient will participate in 5-8 minutes conversation, maintaining average loudness of 75 dB and loud, good quality voice with min cues.      Baseline:  Goal status: INITIAL  5.  The patient will decrease laryngeal and articulatory muscle tension by independently completing relaxation/stretching exercises with min cueing.   Baseline:  Goal status: INITIAL   LONG TERM GOALS: Target date: 12 weeks  Patient will report improved communication effectiveness as measured by PROM  Baseline: VHI 23/120 Goal status: INITIAL  2.  The patient will participate in 15-20 minutes conversation, maintaining average loudness of 75 dB and loud, good quality voice.   Baseline:  Goal status: INITIAL  3.  The patient will decrease laryngeal and articulatory muscle tension by independently  completing relaxation/stretching exercises independently.  Baseline:  Goal status: INITIAL   ASSESSMENT:  CLINICAL IMPRESSION: Patient is a 57 y.o. female who was seen today for voice evaluation in setting of L vocal cord paralysis (confirmed by ENT in September 2024 via laryngoscopy). Patient presents with dysphonia characterized by perceptual features of hoarse, vocal quality, intermittently low vocal intensity and low pitch, and pitch breaks. Pt with tendency to utilize clavicular breathing with phonation/effort. Pt is motivated to improve vocal quality. See above for details of tx session. I recommend skilled ST to improve vocal quality, endurance, and vocal hygiene to meet other vocal demands of work and home.   OBJECTIVE IMPAIRMENTS include voice disorder. These impairments are limiting patient from effectively communicating at home and in community. Factors affecting potential to achieve goals and functional outcome are medical prognosis (paralyzed vocal cord; further medical work up pending). Patient will benefit from skilled SLP services to address above impairments and improve overall function.  REHAB POTENTIAL: Good  PLAN: SLP FREQUENCY: 1-2x/week  SLP DURATION: 12 weeks  PLANNED INTERVENTIONS: Environmental controls, Cueing hierachy, Internal/external aids, Functional tasks, Multimodal communication approach, SLP instruction and feedback, Compensatory strategies, Patient/family education, and Re-evaluation    Clyde Canterbury, M.S., CCC-SLP Speech-Language Pathologist [place] [facility] (  [phone] Arnette Felts)  [facility] Outpatient Rehabilitation at [facility] [address] [place], [place], [address] Phone: [phone]   Fax:  [phone]

## 2023-07-29 ENCOUNTER — Ambulatory Visit: Payer: 59

## 2023-07-29 DIAGNOSIS — R490 Dysphonia: Secondary | ICD-10-CM | POA: Diagnosis not present

## 2023-07-29 NOTE — Therapy (Signed)
OUTPATIENT SPEECH LANGUAGE PATHOLOGY  VOICE TREATMENT   Patient Name: Tara [name] MRN: [id number] DOB:January 12, 1966, 57 y.o., female Today's Date: 07/29/2023  PCP: Dr. Charlton Haws  REFERRING PROVIDER: Dr. Linus Salmons   End of Session - 07/29/23 1203     Visit Number 8    Number of Visits 24    Date for SLP Re-Evaluation 09/27/23    Progress Note Due on Visit 10    SLP Start Time 1019    SLP Stop Time  1100    SLP Time Calculation (min) 41 min    Activity Tolerance Patient tolerated treatment well             Past Medical History:  Diagnosis Date   Breast lump in female    rt side 10 oclock   Elevated BP    Endometriosis of intestine    Sigmoid colon polyp   Fibroid    h/o   Night sweats    Perimenopausal    Vaginal Pap smear, abnormal    ascus.pos   Wears contact lenses    Past Surgical History:  Procedure Laterality Date   ABDOMINAL HYSTERECTOMY  2008   lsh/rso   BREAST EXCISIONAL BIOPSY Right    benign   BREAST SURGERY Right    lumpectomy    COLONOSCOPY WITH PROPOFOL N/A 08/30/2015   Procedure: COLONOSCOPY WITH PROPOFOL;  Surgeon: Midge Minium, MD;  Location: [facility];  Service: Endoscopy;  Laterality: N/A;   OOPHORECTOMY     one ovary left   POLYPECTOMY  08/30/2015   Procedure: POLYPECTOMY;  Surgeon: Midge Minium, MD;  Location: [facility];  Service: Endoscopy;;   Patient Active Problem List   Diagnosis Date Noted   Fatigue 08/11/2018   BV (bacterial vaginosis) 12/22/2016   Special screening for malignant neoplasms, colon    Benign neoplasm of ascending colon    Benign neoplasm of transverse colon    Benign neoplasm of sigmoid colon    Elevated blood pressure 07/31/2015   History of hysterectomy, supracervical 07/31/2015    ONSET DATE:  referral date 06/16/23; late June 2024 onset per pt report   REFERRING DIAG: dysphonia  THERAPY DIAG:  Dysphonia  Rationale for Evaluation and Treatment Rehabilitation  SUBJECTIVE:    SUBJECTIVE STATEMENT: Pt alert, pleasant, and cooperative. Pt had recent appointments with ENT and Pulmonlogist. Planning for bronch, 11/22, per per report. Pt stated the MD thinks whatever showing up in her chest is affecting her Vagus nerve.  Pt accompanied by: self  PERTINENT HISTORY: 57 y.o. female referred by Dr. Jenne Campus for chief complaint of a sudden onset of mild, intermittent hoarseness which started in June 2024.   DIAGNOSTIC FINDINGS: Laryngoscopy on 06/15/23 noted, paramedian L vocal cord paralysis with "good compensation from the R cord." CT chest, 07/22/23, "1. Numerous enlarged mediastinal lymph nodes. 2. Multiple small bilateral pulmonary nodules. 3. Bandlike scarring and volume loss of the right middle lobe. 4. Findings are concerning for malignancy or metastatic disease, however could reflect benign infectious or inflammatory etiology such as granulomatous infection or sarcoidosis. PET-CT may be helpful to assess for abnormal metabolic activity. Consider tissue sampling. 5. Cardiomegaly." CT soft tissue neck, 07/22/23, "1. Findings compatible with left vocal cord paralysis. No dominant neck mass. 2. Mildly enlarged left supraclavicular lymph nodes, indeterminate. Please see the separate chest CT report for thoracic findings and additional recommendations." Head CT, 07/22/23, "negative head CT."  PAIN:  Are you having pain? No   FALLS: Has patient fallen  in last 6 months? No, Number of falls: 0  LIVING ENVIRONMENT: Lives with: lives alone   PLOF: Independent  PATIENT GOALS    to improve voice  OBJECTIVE:  Today's Treatment: Pt watched video re: phonation resistance training ex's. Pt participated in phonation resistance training ex's as follows:  Pt completed x10 sustained /a/ - average 82 db, 12s; min verbal cues Pt completed ascending pitch glides on /ah/ - average 77db, 12s; min verbal cues Pt completed descending pitch glides on /ah/ - average 78 dB,  10s; min verbal verbal cues, including direct modeling, for a strong clear voice; difficulty with maintaining clear voice with lower pitch Pt repeated functional phrases in both a loud, higher pitch voice and strong, authoritative voice; mod verbal cues. Pt averaged 81 dB.    PATIENT EDUCATION: Education details: as above Person educated: Patient Education method: Explanation Education comprehension: verbalized understanding; needs reinforcement   HOME EXERCISE PROGRAM: Resonant voice therapy ex's, breathing ex's, and stretches Phonation resistance training ex's    GOALS: Goals reviewed with patient? Yes  SHORT TERM GOALS: Target date: 10 sessions  The patient will demonstrate abdominal breathing patterns and steady release of breath on exhalation to optimize efficiency of voicing and decrease laryngeal hyperfunction.   Baseline: Goal status: INITIAL  2.  Patient will ID x3 strategies to improve vocal quality, vocal hygiene, voice projection, and prevent vocal fatigue.    Baseline:  Goal status: INITIAL  3.  The patient will utilize a forward tone focused/resonant voice to decrease vocal hyperfunction and improve voice quality and vocal projection.  Baseline:  Goal status: INITIAL  4.  The patient will participate in 5-8 minutes conversation, maintaining average loudness of 75 dB and loud, good quality voice with min cues.      Baseline:  Goal status: INITIAL  5.  The patient will decrease laryngeal and articulatory muscle tension by independently completing relaxation/stretching exercises with min cueing.   Baseline:  Goal status: INITIAL   LONG TERM GOALS: Target date: 12 weeks  Patient will report improved communication effectiveness as measured by PROM  Baseline: VHI 23/120 Goal status: INITIAL  2.  The patient will participate in 15-20 minutes conversation, maintaining average loudness of 75 dB and loud, good quality voice.   Baseline:  Goal status:  INITIAL  3.  The patient will decrease laryngeal and articulatory muscle tension by independently completing relaxation/stretching exercises independently.  Baseline:  Goal status: INITIAL   ASSESSMENT:  CLINICAL IMPRESSION: Patient is a 57 y.o. female who was seen today for voice evaluation in setting of L vocal cord paralysis (confirmed by ENT in September 2024 via laryngoscopy). Patient presents with dysphonia characterized by perceptual features of hoarse, vocal quality, intermittently low vocal intensity and low pitch, and pitch breaks. Pt with tendency to utilize clavicular breathing with phonation/effort. Pt is motivated to improve vocal quality. See above for details of tx session. I recommend skilled ST to improve vocal quality, endurance, and vocal hygiene to meet other vocal demands of work and home.   OBJECTIVE IMPAIRMENTS include voice disorder. These impairments are limiting patient from effectively communicating at home and in community. Factors affecting potential to achieve goals and functional outcome are medical prognosis (paralyzed vocal cord; further medical work up pending). Patient will benefit from skilled SLP services to address above impairments and improve overall function.  REHAB POTENTIAL: Good  PLAN: SLP FREQUENCY: 1-2x/week  SLP DURATION: 12 weeks  PLANNED INTERVENTIONS: Environmental controls, Cueing hierachy, Internal/external aids, Functional tasks, Multimodal  communication approach, SLP instruction and feedback, Compensatory strategies, Patient/family education, and Re-evaluation    Clyde Canterbury, M.S., CCC-SLP Speech-Language Pathologist [place] [facility] [phone] Arnette Felts)  [place] [facility] Outpatient Rehabilitation at [facility] [address] [place], [place], [address] Phone: [phone]   Fax:  [phone]

## 2023-08-02 ENCOUNTER — Ambulatory Visit: Payer: 59 | Attending: Unknown Physician Specialty

## 2023-08-02 DIAGNOSIS — R490 Dysphonia: Secondary | ICD-10-CM | POA: Diagnosis present

## 2023-08-02 NOTE — Therapy (Signed)
OUTPATIENT SPEECH LANGUAGE PATHOLOGY  VOICE TREATMENT   Patient Name: Tara [name] MRN: [id number] DOB:09/20/1966, 57 y.o., female Today's Date: 08/02/2023  PCP: Dr. Charlton Haws  REFERRING PROVIDER: Dr. Linus Salmons   End of Session - 08/02/23 1209     Visit Number 9    Number of Visits 24    Date for SLP Re-Evaluation 09/27/23    Progress Note Due on Visit 10    SLP Start Time 1105    SLP Stop Time  1205    SLP Time Calculation (min) 60 min    Activity Tolerance Patient tolerated treatment well             Past Medical History:  Diagnosis Date   Breast lump in female    rt side 10 oclock   Elevated BP    Endometriosis of intestine    Sigmoid colon polyp   Fibroid    h/o   Night sweats    Perimenopausal    Vaginal Pap smear, abnormal    ascus.pos   Wears contact lenses    Past Surgical History:  Procedure Laterality Date   ABDOMINAL HYSTERECTOMY  2008   lsh/rso   BREAST EXCISIONAL BIOPSY Right    benign   BREAST SURGERY Right    lumpectomy    COLONOSCOPY WITH PROPOFOL N/A 08/30/2015   Procedure: COLONOSCOPY WITH PROPOFOL;  Surgeon: Midge Minium, MD;  Location: [facility];  Service: Endoscopy;  Laterality: N/A;   OOPHORECTOMY     one ovary left   POLYPECTOMY  08/30/2015   Procedure: POLYPECTOMY;  Surgeon: Midge Minium, MD;  Location: [facility];  Service: Endoscopy;;   Patient Active Problem List   Diagnosis Date Noted   Fatigue 08/11/2018   BV (bacterial vaginosis) 12/22/2016   Special screening for malignant neoplasms, colon    Benign neoplasm of ascending colon    Benign neoplasm of transverse colon    Benign neoplasm of sigmoid colon    Elevated blood pressure 07/31/2015   History of hysterectomy, supracervical 07/31/2015    ONSET DATE:  referral date 06/16/23; late June 2024 onset per pt report   REFERRING DIAG: dysphonia  THERAPY DIAG:  Dysphonia  Rationale for Evaluation and Treatment Rehabilitation  SUBJECTIVE:    SUBJECTIVE STATEMENT: Pt alert, pleasant, and cooperative. Reports that her voice held up "pretty good" over the weekend.  Pt accompanied by: self  PERTINENT HISTORY: 57 y.o. female referred by Dr. Jenne Campus for chief complaint of a sudden onset of mild, intermittent hoarseness which started in June 2024.   DIAGNOSTIC FINDINGS: Laryngoscopy on 06/15/23 noted, paramedian L vocal cord paralysis with "good compensation from the R cord." CT chest, 07/22/23, "1. Numerous enlarged mediastinal lymph nodes. 2. Multiple small bilateral pulmonary nodules. 3. Bandlike scarring and volume loss of the right middle lobe. 4. Findings are concerning for malignancy or metastatic disease, however could reflect benign infectious or inflammatory etiology such as granulomatous infection or sarcoidosis. PET-CT may be helpful to assess for abnormal metabolic activity. Consider tissue sampling. 5. Cardiomegaly." CT soft tissue neck, 07/22/23, "1. Findings compatible with left vocal cord paralysis. No dominant neck mass. 2. Mildly enlarged left supraclavicular lymph nodes, indeterminate. Please see the separate chest CT report for thoracic findings and additional recommendations." Head CT, 07/22/23, "negative head CT."  PAIN:  Are you having pain? No   FALLS: Has patient fallen in last 6 months? No, Number of falls: 0  LIVING ENVIRONMENT: Lives with: lives alone   PLOF: Independent  PATIENT GOALS    to improve voice  OBJECTIVE:  Today's Treatment: Reviewed phonation resistance training ex's. Pt participated in phonation resistance training ex's as follows:  Pt completed x10 sustained /a/ - average 82 db, 10s; min verbal cues Pt completed ascending pitch glides on /ah/ - average 81db, 10s; min verbal cues Pt completed descending pitch glides on /ah/ - average 79 dB, 10s; min verbal verbal cues, including direct modeling, for a strong clear voice; difficulty with maintaining clear voice with lower  pitch Pt repeated functional phrases in both a loud, higher pitch voice and strong, authoritative voice; mod verbal cues. Pt averaged 81 dB. Pt able to sustain adequate loudness and clear vocal quality during conversation with use of biofeedback (Voice Analyst App).    PATIENT EDUCATION: Education details: as above Person educated: Patient Education method: Explanation Education comprehension: verbalized understanding; needs reinforcement   HOME EXERCISE PROGRAM: Resonant voice therapy ex's, breathing ex's, and stretches Phonation resistance training ex's    GOALS: Goals reviewed with patient? Yes  SHORT TERM GOALS: Target date: 10 sessions  The patient will demonstrate abdominal breathing patterns and steady release of breath on exhalation to optimize efficiency of voicing and decrease laryngeal hyperfunction.   Baseline: Goal status: INITIAL  2.  Patient will ID x3 strategies to improve vocal quality, vocal hygiene, voice projection, and prevent vocal fatigue.    Baseline:  Goal status: INITIAL  3.  The patient will utilize a forward tone focused/resonant voice to decrease vocal hyperfunction and improve voice quality and vocal projection.  Baseline:  Goal status: INITIAL  4.  The patient will participate in 5-8 minutes conversation, maintaining average loudness of 75 dB and loud, good quality voice with min cues.      Baseline:  Goal status: INITIAL  5.  The patient will decrease laryngeal and articulatory muscle tension by independently completing relaxation/stretching exercises with min cueing.   Baseline:  Goal status: INITIAL   LONG TERM GOALS: Target date: 12 weeks  Patient will report improved communication effectiveness as measured by PROM  Baseline: VHI 23/120 Goal status: INITIAL  2.  The patient will participate in 15-20 minutes conversation, maintaining average loudness of 75 dB and loud, good quality voice.   Baseline:  Goal status: INITIAL  3.   The patient will decrease laryngeal and articulatory muscle tension by independently completing relaxation/stretching exercises independently.  Baseline:  Goal status: INITIAL   ASSESSMENT:  CLINICAL IMPRESSION: Patient is a 57 y.o. female who was seen today for voice evaluation in setting of L vocal cord paralysis (confirmed by ENT in September 2024 via laryngoscopy). Patient presents with dysphonia characterized by perceptual features of hoarse, vocal quality, intermittently low vocal intensity and low pitch, and pitch breaks. Pt with tendency to utilize clavicular breathing with phonation/effort. Pt is motivated to improve vocal quality. See above for details of tx session. I recommend skilled ST to improve vocal quality, endurance, and vocal hygiene to meet other vocal demands of work and home.   OBJECTIVE IMPAIRMENTS include voice disorder. These impairments are limiting patient from effectively communicating at home and in community. Factors affecting potential to achieve goals and functional outcome are medical prognosis (paralyzed vocal cord; further medical work up pending). Patient will benefit from skilled SLP services to address above impairments and improve overall function.  REHAB POTENTIAL: Good  PLAN: SLP FREQUENCY: 1-2x/week  SLP DURATION: 12 weeks  PLANNED INTERVENTIONS: Environmental controls, Cueing hierachy, Internal/external aids, Functional tasks, Multimodal communication approach, SLP instruction and  feedback, Compensatory strategies, Patient/family education, and Re-evaluation    Clyde Canterbury, M.S., CCC-SLP Speech-Language Pathologist [place] [facility] [phone] Arnette Felts)  [facility] at [facility] [address] [place], [place], [address] Phone: [phone]   Fax:  [phone]

## 2023-08-03 ENCOUNTER — Other Ambulatory Visit: Payer: Self-pay

## 2023-08-03 ENCOUNTER — Emergency Department
Admission: EM | Admit: 2023-08-03 | Discharge: 2023-08-04 | Disposition: A | Discharge status: Home / Self Care | Payer: 59 | Attending: Emergency Medicine | Admitting: Emergency Medicine

## 2023-08-03 ENCOUNTER — Encounter: Payer: Self-pay | Admitting: Emergency Medicine

## 2023-08-03 DIAGNOSIS — I10 Essential (primary) hypertension: Secondary | ICD-10-CM | POA: Diagnosis present

## 2023-08-03 DIAGNOSIS — E876 Hypokalemia: Secondary | ICD-10-CM | POA: Insufficient documentation

## 2023-08-03 LAB — CBC WITH DIFFERENTIAL/PLATELET
Abs Immature Granulocytes: 0.01 10*3/uL (ref 0.00–0.07)
Basophils Absolute: 0 10*3/uL (ref 0.0–0.1)
Basophils Relative: 1 %
Eosinophils Absolute: 0.1 10*3/uL (ref 0.0–0.5)
Eosinophils Relative: 2 %
HCT: 45.5 % (ref 36.0–46.0)
Hemoglobin: 14.8 g/dL (ref 12.0–15.0)
Immature Granulocytes: 0 %
Lymphocytes Relative: 48 %
Lymphs Abs: 2.6 10*3/uL (ref 0.7–4.0)
MCH: 25.7 pg — ABNORMAL LOW (ref 26.0–34.0)
MCHC: 32.5 g/dL (ref 30.0–36.0)
MCV: 79 fL — ABNORMAL LOW (ref 80.0–100.0)
Monocytes Absolute: 0.6 10*3/uL (ref 0.1–1.0)
Monocytes Relative: 10 %
Neutro Abs: 2.2 10*3/uL (ref 1.7–7.7)
Neutrophils Relative %: 39 %
Platelets: 241 10*3/uL (ref 150–400)
RBC: 5.76 MIL/uL — ABNORMAL HIGH (ref 3.87–5.11)
RDW: 14.5 % (ref 11.5–15.5)
WBC: 5.5 10*3/uL (ref 4.0–10.5)
nRBC: 0 % (ref 0.0–0.2)

## 2023-08-03 LAB — BASIC METABOLIC PANEL
Anion gap: 8 (ref 5–15)
BUN: 10 mg/dL (ref 6–20)
CO2: 26 mmol/L (ref 22–32)
Calcium: 9.2 mg/dL (ref 8.9–10.3)
Chloride: 104 mmol/L (ref 98–111)
Creatinine, Ser: 0.82 mg/dL (ref 0.44–1.00)
GFR, Estimated: >60 mL/min (ref >60)
Glucose, Bld: 114 mg/dL — ABNORMAL HIGH (ref 70–99)
Potassium: 3 mmol/L — ABNORMAL LOW (ref 3.5–5.1)
Sodium: 138 mmol/L (ref 135–145)

## 2023-08-03 MED ORDER — HYDROCHLOROTHIAZIDE 12.5 MG PO TABS
25.0000 mg | ORAL_TABLET | Freq: Every day | ORAL | 1 refills | Status: AC
Start: 2023-08-03 — End: 2023-10-02

## 2023-08-03 MED ORDER — POTASSIUM CHLORIDE CRYS ER 20 MEQ PO TBCR
40.0000 meq | EXTENDED_RELEASE_TABLET | Freq: Once | ORAL | Status: AC
  Administered 2023-08-03: 40 meq via ORAL
  Filled 2023-08-03: qty 2

## 2023-08-03 MED ORDER — LABETALOL HCL 5 MG/ML IV SOLN
10.0000 mg | Freq: Once | INTRAVENOUS | Status: AC
  Administered 2023-08-03: 10 mg via INTRAVENOUS
  Filled 2023-08-03: qty 4

## 2023-08-03 MED ORDER — HYDROCHLOROTHIAZIDE 12.5 MG PO TABS
12.5000 mg | ORAL_TABLET | Freq: Once | ORAL | Status: AC
  Administered 2023-08-03: 12.5 mg via ORAL
  Filled 2023-08-03: qty 1

## 2023-08-03 NOTE — ED Triage Notes (Signed)
Patient to ED form [facility] (no report given from staff) for hypertension. Pt unable to get in with PCP so went to walk-in and sent to ED. BP 249/101. Denies headache or blurred vision.

## 2023-08-03 NOTE — ED Provider Notes (Signed)
[facility] Provider Note    Event Date/Time   First MD Initiated Contact with Patient 08/03/23 2025     (approximate)   History   Hypertension   HPI Tara [name] is a 57 y.o. female presenting today for high blood pressure.  Patient was seen by her doctor recently over vocal cord paralysis.  While there, she was noted to have elevated blood pressure.  They referred her for primary care provider but cannot be seen till December.  She was told to come to the ED for further evaluation given blood pressure with systolic greater than 200.  She otherwise denies vision changes, headaches, chest pain, abdominal pain, decreased urination, numbness or weakness anywhere.  No prior history of high blood pressure.     Physical Exam   Triage Vital Signs: ED Triage Vitals  Encounter Vitals Group     BP 08/03/23 1845 (!) 226/119     Systolic BP Percentile --      Diastolic BP Percentile --      Pulse Rate 08/03/23 1845 77     Resp 08/03/23 1845 18     Temp 08/03/23 1845 97.6 F (36.4 C)     Temp src --      SpO2 08/03/23 1845 100 %     Weight 08/03/23 1846 190 lb (86.2 kg)     Height 08/03/23 1846 5\' 7"  (1.702 m)     Head Circumference --      Peak Flow --      Pain Score 08/03/23 1845 0     Pain Loc --      Pain Education --      Exclude from Growth Chart --     Most recent vital signs: Vitals:   08/03/23 2200 08/03/23 2209  BP:  (!) 180/95  Pulse: 60 60  Resp:  15  Temp:    SpO2: 100% 100%   Physical Exam: I have reviewed the vital signs and nursing notes. General: Awake, alert, no acute distress.  Nontoxic appearing. Head:  Atraumatic, normocephalic.   ENT:  EOM intact, PERRL. Oral mucosa is pink and moist with no lesions. Neck: Neck is supple with full range of motion, No meningeal signs. Cardiovascular:  RRR, No murmurs. Peripheral pulses palpable and equal bilaterally. Respiratory:  Symmetrical chest wall expansion.  No rhonchi, rales, or  wheezes.  Good air movement throughout.  No use of accessory muscles.   Musculoskeletal:  No cyanosis or edema. Moving extremities with full ROM Abdomen:  Soft, nontender, nondistended. Neuro:  GCS 15, moving all four extremities, interacting appropriately. Speech clear. Psych:  Calm, appropriate.   Skin:  Warm, dry, no rash.    ED Results / Procedures / Treatments   Labs (all labs ordered are listed, but only abnormal results are displayed) Labs Reviewed  CBC WITH DIFFERENTIAL/PLATELET - Abnormal; Notable for the following components:      Result Value   RBC 5.76 (*)    MCV 79.0 (*)    MCH 25.7 (*)    All other components within normal limits  BASIC METABOLIC PANEL - Abnormal; Notable for the following components:   Potassium 3.0 (*)    Glucose, Bld 114 (*)    All other components within normal limits     EKG See ED course for my EKG interpretation   RADIOLOGY    PROCEDURES:  Critical Care performed: No  Procedures   MEDICATIONS ORDERED IN ED: Medications  labetalol (NORMODYNE) injection 10  mg (10 mg Intravenous Given 08/03/23 2126)  hydrochlorothiazide (HYDRODIURIL) tablet 12.5 mg (12.5 mg Oral Given 08/03/23 2050)  potassium chloride SA (KLOR-CON M) CR tablet 40 mEq (40 mEq Oral Given 08/03/23 2051)     IMPRESSION / MDM / ASSESSMENT AND PLAN / ED COURSE  I reviewed the triage vital signs and the nursing notes.                              Differential diagnosis includes, but is not limited to, asymptomatic hypertension, AKI, electrolyte abnormality  Patient's presentation is most consistent with acute complicated illness / injury requiring diagnostic workup.  Patient is a 57 year old female presenting today for asymptomatic hypertension.  On arrival blood pressure with systolics in the 220s.  Patient otherwise with unremarkable physical exam.  Neurological exam normal.  Patient with no acute complaints.  Laboratory workup performed shows mild hypokalemia but  no evidence of kidney damage.  The rest of her CBC and BMP were unremarkable.  EKG reassuring.  Patient was given labetalol and hydrochlorothiazide here in the emergency department.  Also given potassium repletion.  Improvement in blood pressure to less than 200 systolic.  Otherwise reassuring workup and will plan for discharge for uncontrolled hypertension.  Patient will be started on hydrochlorothiazide.  Told to monitor blood pressures and follow-up with primary care doctor as planned.  Given strict return precautions.  The patient is on the cardiac monitor to evaluate for evidence of arrhythmia and/or significant heart rate changes. Clinical Course as of 08/03/23 2306  Tue Aug 03, 2023  2215 My EKG interpretation: Rate of 79, normal sinus rhythm, normal axis, normal intervals.  No acute ST elevations or depressions [DW]    Clinical Course User Index [DW] Janith Lima, MD     FINAL CLINICAL IMPRESSION(S) / ED DIAGNOSES   Final diagnoses:  Uncontrolled hypertension     Rx / DC Orders   ED Discharge Orders          Ordered    hydrochlorothiazide (HYDRODIURIL) 12.5 MG tablet  Daily        08/03/23 2240             Note:  This document was prepared using Dragon voice recognition software and may include unintentional dictation errors.   Janith Lima, MD 08/03/23 [phone]

## 2023-08-03 NOTE — Discharge Instructions (Signed)

## 2023-08-03 NOTE — ED Notes (Signed)
Provided graham crackers pt aox4 denies SOB denies CP provided blankets for comfort

## 2023-08-05 ENCOUNTER — Ambulatory Visit: Payer: 59

## 2023-08-05 DIAGNOSIS — R490 Dysphonia: Secondary | ICD-10-CM | POA: Diagnosis not present

## 2023-08-05 NOTE — Therapy (Signed)
OUTPATIENT SPEECH LANGUAGE PATHOLOGY  VOICE TREATMENT / 10th Visit Progress Note  Speech Therapy Progress Note  Dates of Reporting Period: 07/05/2023 to 08/05/2023  Objective: Patient has been seen for 10 speech therapy sessions this reporting period targeting dysphonia. Patient is making progress toward LTGs and met 4/5 STGs this reporting period. See skilled intervention, clinical impressions, and goals below for details.    Patient Name: Tara [name] MRN: [id number] DOB:[birth year], 57 y.o., female Today's Date: 08/05/2023  PCP: Dr. Charlton Haws  REFERRING PROVIDER: Dr. Linus Salmons      End of Session - 08/05/23 1142     Visit Number 10    Number of Visits 24    Date for SLP Re-Evaluation 09/27/23    Progress Note Due on Visit 20    SLP Start Time 1020    SLP Stop Time  1105    SLP Time Calculation (min) 45 min    Activity Tolerance Patient tolerated treatment well             Past Medical History:  Diagnosis Date   Breast lump in female    rt side 10 oclock   Elevated BP    Endometriosis of intestine    Sigmoid colon polyp   Fibroid    h/o   Night sweats    Perimenopausal    Vaginal Pap smear, abnormal    ascus.pos   Wears contact lenses    Past Surgical History:  Procedure Laterality Date   ABDOMINAL HYSTERECTOMY  2008   lsh/rso   BREAST EXCISIONAL BIOPSY Right    benign   BREAST SURGERY Right    lumpectomy    COLONOSCOPY WITH PROPOFOL N/A 08/30/2015   Procedure: COLONOSCOPY WITH PROPOFOL;  Surgeon: Midge Minium, MD;  Location: [facility];  Service: Endoscopy;  Laterality: N/A;   OOPHORECTOMY     one ovary left   POLYPECTOMY  08/30/2015   Procedure: POLYPECTOMY;  Surgeon: Midge Minium, MD;  Location: [facility];  Service: Endoscopy;;   Patient Active Problem List   Diagnosis Date Noted   Fatigue 08/11/2018   BV (bacterial vaginosis) 12/22/2016   Special screening for malignant neoplasms, colon    Benign neoplasm of  ascending colon    Benign neoplasm of transverse colon    Benign neoplasm of sigmoid colon    Elevated blood pressure 07/31/2015   History of hysterectomy, supracervical 07/31/2015    ONSET DATE:  referral date 06/16/23; late June 2024 onset per pt report   REFERRING DIAG: dysphonia  THERAPY DIAG:  Dysphonia  Rationale for Evaluation and Treatment Rehabilitation  SUBJECTIVE:   SUBJECTIVE STATEMENT: Pt alert, pleasant, and cooperative. Endorse feeling tired.  "I've had a time." (Regarding recent ED visit for hypertension)  Pt accompanied by: self  PERTINENT HISTORY: 57 y.o. female referred by Dr. Jenne Campus for chief complaint of a sudden onset of mild, intermittent hoarseness which started in June 2024.   DIAGNOSTIC FINDINGS: Laryngoscopy on 06/15/23 noted, paramedian L vocal cord paralysis with "good compensation from the R cord." CT chest, 07/22/23, "1. Numerous enlarged mediastinal lymph nodes. 2. Multiple small bilateral pulmonary nodules. 3. Bandlike scarring and volume loss of the right middle lobe. 4. Findings are concerning for malignancy or metastatic disease, however could reflect benign infectious or inflammatory etiology such as granulomatous infection or sarcoidosis. PET-CT may be helpful to assess for abnormal metabolic activity. Consider tissue sampling. 5. Cardiomegaly." CT soft tissue neck, 07/22/23, "1. Findings compatible with left vocal cord paralysis. No  dominant neck mass. 2. Mildly enlarged left supraclavicular lymph nodes, indeterminate. Please see the separate chest CT report for thoracic findings and additional recommendations." Head CT, 07/22/23, "negative head CT."  PAIN:  Are you having pain? No   FALLS: Has patient fallen in last 6 months? No, Number of falls: 0  LIVING ENVIRONMENT: Lives with: lives alone   PLOF: Independent  PATIENT GOALS    to improve voice  OBJECTIVE:  Today's Treatment: Reviewed phonation resistance training ex's.  Pt participated in phonation resistance training ex's as follows:  Pt completed x10 sustained /a/ - average 75 db, 10s; min verbal cues Pt completed ascending pitch glides on /ah/ - average 81 db, 7-8s; min verbal cues Pt completed descending pitch glides on /ah/ - average 79 dB, 7-8s; min verbal verbal cues, including direct modeling, for a strong clear voice; difficulty with maintaining clear voice with lower pitch Pt repeated functional phrases in both a loud, higher pitch voice and strong, authoritative voice; mod verbal cues. Pt averaged 80 dB. Pt able to sustain adequate loudness with intermittently hoarse vocal quality during brief conversation with use of biofeedback (Voice Analyst App). Longerconversational sample to be obtained in upcoming visits.    PATIENT EDUCATION: Education details: as above Person educated: Patient Education method: Explanation Education comprehension: verbalized understanding; needs reinforcement   HOME EXERCISE PROGRAM: Resonant voice therapy ex's, breathing ex's, and stretches Phonation resistance training ex's    GOALS: Goals reviewed with patient? Yes  SHORT TERM GOALS: Target date: 10 sessions  The patient will demonstrate abdominal breathing patterns and steady release of breath on exhalation to optimize efficiency of voicing and decrease laryngeal hyperfunction.   Baseline: Goal status: MET  2.  Patient will ID x3 strategies to improve vocal quality, vocal hygiene, voice projection, and prevent vocal fatigue.    Baseline:  Goal status: MET  3.  The patient will utilize a forward tone focused/resonant voice to decrease vocal hyperfunction and improve voice quality and vocal projection.  Baseline:  Goal status: MET  4.  The patient will participate in 5-8 minutes conversation, maintaining average loudness of 75 dB and loud, good quality voice with min cues.      Baseline:  Goal status: IN PROGRESS  5.  The patient will decrease  laryngeal and articulatory muscle tension by independently completing relaxation/stretching exercises with min cueing.   Baseline:  Goal status: MET   LONG TERM GOALS: Target date: 12 weeks  Patient will report improved communication effectiveness as measured by PROM  Baseline: VHI 23/120 Goal status: IN PROGRESS  2.  The patient will participate in 15-20 minutes conversation, maintaining average loudness of 75 dB and loud, good quality voice.   Baseline:  Goal status: IN PROGRESS  3.  The patient will decrease laryngeal and articulatory muscle tension by independently completing relaxation/stretching exercises independently.  Baseline:  Goal status: MET   ASSESSMENT:  CLINICAL IMPRESSION: Patient is a 57 y.o. female who was seen today for voice evaluation in setting of L vocal cord paralysis (confirmed by ENT in September 2024 via laryngoscopy). Patient presents with dysphonia characterized by perceptual features of hoarse, vocal quality, intermittently low vocal intensity and low pitch, and pitch breaks. Pt with tendency to utilize clavicular breathing with phonation/effort. Pt is motivated to improve vocal quality. See above for details of tx session. I recommend skilled ST to improve vocal quality, endurance, and vocal hygiene to meet other vocal demands of work and home.   OBJECTIVE IMPAIRMENTS include voice disorder.  These impairments are limiting patient from effectively communicating at home and in community. Factors affecting potential to achieve goals and functional outcome are medical prognosis (paralyzed vocal cord; further medical work up pending). Patient will benefit from skilled SLP services to address above impairments and improve overall function.  REHAB POTENTIAL: Good  PLAN: SLP FREQUENCY: 1-2x/week  SLP DURATION: 12 weeks  PLANNED INTERVENTIONS: Environmental controls, Cueing hierachy, Internal/external aids, Functional tasks, Multimodal communication  approach, SLP instruction and feedback, Compensatory strategies, Patient/family education, and Re-evaluation    Clyde Canterbury, M.S., CCC-SLP Speech-Language Pathologist [place] [facility] [phone] Arnette Felts)  [facility] at [facility] [address] [place], [place], [address] Phone: [phone]   Fax:  [phone]

## 2023-08-06 ENCOUNTER — Telehealth: Payer: Self-pay

## 2023-08-06 NOTE — Telephone Encounter (Signed)
Spoke with patient regarding her BP concerns. She states taking medication prescribed by the ED since Wednesday, hydrochlorothiazide 12.5 mg bid. She states today she has not eaten and is unsure of her last BP reading, thinks it was around 170. Reports she is using a cuff at home. Discussed proper ways to take her BP at home. Advised pt to check her BP 3 times a day. Patient was given 2 alternate PCP offices to call to see if she can be seen sooner. Discussed warning signs of when she would need to return to the hospital. Patient voiced understanding.

## 2023-08-06 NOTE — Telephone Encounter (Signed)
Pt was seen in the er for BP 226/119 they gave her Hydrochlorothiazide 25mg  take 2 tablets daily. She was told to follow up with her PCP. She states She only sees DR Logan Bores. She made an appointment with a PCP ans they can not see her till 12/6. She has an annual with Dr Logan Bores on 12/1. She wants to know if he can see her before then to prescribe something to take for the BP or is the  Hydrochlorothiazide safe to continue to take until the pcp appointment? I advised her we dont manage BP here unless you are pregnant. Please advise

## 2023-08-09 ENCOUNTER — Ambulatory Visit: Payer: 59

## 2023-08-09 DIAGNOSIS — R490 Dysphonia: Secondary | ICD-10-CM

## 2023-08-09 NOTE — Therapy (Addendum)
OUTPATIENT SPEECH LANGUAGE PATHOLOGY  VOICE TREATMENT  Patient Name: Tara [name] MRN: [id number] DOB:05-Mar-1966, 57 y.o., female Today's Date: 08/09/2023  PCP: Dr. Charlton Haws  REFERRING PROVIDER: Dr. Linus Salmons      End of Session - 08/09/23 1040     Visit Number 11    Number of Visits 24    Date for SLP Re-Evaluation 09/27/23    Progress Note Due on Visit 20    SLP Start Time 1015    SLP Stop Time  1100    SLP Time Calculation (min) 45 min    Activity Tolerance Patient tolerated treatment well             Past Medical History:  Diagnosis Date   Breast lump in female    rt side 10 oclock   Elevated BP    Endometriosis of intestine    Sigmoid colon polyp   Fibroid    h/o   Night sweats    Perimenopausal    Vaginal Pap smear, abnormal    ascus.pos   Wears contact lenses    Past Surgical History:  Procedure Laterality Date   ABDOMINAL HYSTERECTOMY  2008   lsh/rso   BREAST EXCISIONAL BIOPSY Right    benign   BREAST SURGERY Right    lumpectomy    COLONOSCOPY WITH PROPOFOL N/A 08/30/2015   Procedure: COLONOSCOPY WITH PROPOFOL;  Surgeon: Midge Minium, MD;  Location: [facility];  Service: Endoscopy;  Laterality: N/A;   OOPHORECTOMY     one ovary left   POLYPECTOMY  08/30/2015   Procedure: POLYPECTOMY;  Surgeon: Midge Minium, MD;  Location: [facility];  Service: Endoscopy;;   Patient Active Problem List   Diagnosis Date Noted   Fatigue 08/11/2018   BV (bacterial vaginosis) 12/22/2016   Special screening for malignant neoplasms, colon    Benign neoplasm of ascending colon    Benign neoplasm of transverse colon    Benign neoplasm of sigmoid colon    Elevated blood pressure 07/31/2015   History of hysterectomy, supracervical 07/31/2015    ONSET DATE:  referral date 06/16/23; late June 2024 onset per pt report   REFERRING DIAG: dysphonia  THERAPY DIAG:  Dysphonia  Rationale for Evaluation and Treatment  Rehabilitation  SUBJECTIVE:   SUBJECTIVE STATEMENT: Pt alert, pleasant, and cooperative. Endorsed occasional "loopy" feeling since starting BP medicine.  Pt accompanied by: self  PERTINENT HISTORY: 57 y.o. female referred by Dr. Jenne Campus for chief complaint of a sudden onset of mild, intermittent hoarseness which started in June 2024.   DIAGNOSTIC FINDINGS: Laryngoscopy on 06/15/23 noted, paramedian L vocal cord paralysis with "good compensation from the R cord." CT chest, 07/22/23, "1. Numerous enlarged mediastinal lymph nodes. 2. Multiple small bilateral pulmonary nodules. 3. Bandlike scarring and volume loss of the right middle lobe. 4. Findings are concerning for malignancy or metastatic disease, however could reflect benign infectious or inflammatory etiology such as granulomatous infection or sarcoidosis. PET-CT may be helpful to assess for abnormal metabolic activity. Consider tissue sampling. 5. Cardiomegaly." CT soft tissue neck, 07/22/23, "1. Findings compatible with left vocal cord paralysis. No dominant neck mass. 2. Mildly enlarged left supraclavicular lymph nodes, indeterminate. Please see the separate chest CT report for thoracic findings and additional recommendations." Head CT, 07/22/23, "negative head CT."  PAIN:  Are you having pain? No   FALLS: Has patient fallen in last 6 months? No, Number of falls: 0  LIVING ENVIRONMENT: Lives with: lives alone   PLOF: Independent  PATIENT GOALS    to improve voice  OBJECTIVE:  Today's Treatment: Reviewed phonation resistance training ex's. Pt participated in phonation resistance training ex's as follows:  Pt completed x10 sustained /a/ - average 75 db, 10s; min verbal cues Pt completed ascending pitch glides on /ah/ - average 81 db, 7-8s; min verbal cues Pt completed descending pitch glides on /ah/ - average  dB, 7-8s; min verbal verbal cues, including direct modeling, for a strong clear voice; difficulty with  maintaining clear voice with lower pitch Pt repeated functional phrases in both a loud, higher pitch voice and strong, authoritative voice; mod verbal cues. Pt averaged 80 dB. Pt able to sustain adequate loudness with intermittently hoarse vocal quality during conversation (10-15 minutes) with use of biofeedback (Voice Analyst App).    PATIENT EDUCATION: Education details: as above Person educated: Patient Education method: Explanation Education comprehension: verbalized understanding; needs reinforcement   HOME EXERCISE PROGRAM: Resonant voice therapy ex's, breathing ex's, and stretches Phonation resistance training ex's    GOALS: Goals reviewed with patient? Yes  SHORT TERM GOALS: Target date: 10 sessions  The patient will demonstrate abdominal breathing patterns and steady release of breath on exhalation to optimize efficiency of voicing and decrease laryngeal hyperfunction.   Baseline: Goal status: MET  2.  Patient will ID x3 strategies to improve vocal quality, vocal hygiene, voice projection, and prevent vocal fatigue.    Baseline:  Goal status: MET  3.  The patient will utilize a forward tone focused/resonant voice to decrease vocal hyperfunction and improve voice quality and vocal projection.  Baseline:  Goal status: MET  4.  The patient will participate in 5-8 minutes conversation, maintaining average loudness of 75 dB and loud, good quality voice with min cues.      Baseline:  Goal status: IN PROGRESS  5.  The patient will decrease laryngeal and articulatory muscle tension by independently completing relaxation/stretching exercises with min cueing.   Baseline:  Goal status: MET   LONG TERM GOALS: Target date: 12 weeks  Patient will report improved communication effectiveness as measured by PROM  Baseline: VHI 23/120 Goal status: IN PROGRESS  2.  The patient will participate in 15-20 minutes conversation, maintaining average loudness of 75 dB and loud,  good quality voice.   Baseline:  Goal status: IN PROGRESS  3.  The patient will decrease laryngeal and articulatory muscle tension by independently completing relaxation/stretching exercises independently.  Baseline:  Goal status: MET   ASSESSMENT:  CLINICAL IMPRESSION: Patient is a 57 y.o. female who was seen today for voice evaluation in setting of L vocal cord paralysis (confirmed by ENT in September 2024 via laryngoscopy). Patient presents with dysphonia characterized by perceptual features of hoarse, vocal quality, intermittently low vocal intensity and low pitch, and pitch breaks. Pt with tendency to utilize clavicular breathing with phonation/effort. Pt is motivated to improve vocal quality. See above for details of tx session. I recommend skilled ST to improve vocal quality, endurance, and vocal hygiene to meet other vocal demands of work and home.   OBJECTIVE IMPAIRMENTS include voice disorder. These impairments are limiting patient from effectively communicating at home and in community. Factors affecting potential to achieve goals and functional outcome are medical prognosis (paralyzed vocal cord; further medical work up pending). Patient will benefit from skilled SLP services to address above impairments and improve overall function.  REHAB POTENTIAL: Good  PLAN: SLP FREQUENCY: 1-2x/week  SLP DURATION: 12 weeks  PLANNED INTERVENTIONS: Environmental controls, Cueing hierachy, Internal/external aids, Functional  tasks, Multimodal communication approach, SLP instruction and feedback, Compensatory strategies, Patient/family education, and Re-evaluation    Clyde Canterbury, M.S., CCC-SLP Speech-Language Pathologist [place] [facility] [phone] Arnette Felts)  [facility] Out[facility] Rehabilitation at [facility] [address] [place], [place], [address] Phone: [phone]   Fax:  [phone]

## 2023-08-12 ENCOUNTER — Ambulatory Visit: Payer: 59

## 2023-08-12 ENCOUNTER — Other Ambulatory Visit: Payer: Self-pay

## 2023-08-12 ENCOUNTER — Encounter
Admission: RE | Admit: 2023-08-12 | Discharge: 2023-08-12 | Disposition: A | Discharge status: Home / Self Care | Payer: 59 | Source: Ambulatory Visit | Attending: Pulmonary Disease | Admitting: Pulmonary Disease

## 2023-08-12 VITALS — BP 162/104 | HR 74 | Temp 98.2°F | Resp 14 | Ht 67.0 in | Wt 187.0 lb

## 2023-08-12 DIAGNOSIS — R590 Localized enlarged lymph nodes: Secondary | ICD-10-CM

## 2023-08-12 DIAGNOSIS — E876 Hypokalemia: Secondary | ICD-10-CM

## 2023-08-12 DIAGNOSIS — R490 Dysphonia: Secondary | ICD-10-CM

## 2023-08-12 DIAGNOSIS — R918 Other nonspecific abnormal finding of lung field: Secondary | ICD-10-CM

## 2023-08-12 DIAGNOSIS — Z79899 Other long term (current) drug therapy: Secondary | ICD-10-CM

## 2023-08-12 DIAGNOSIS — Z01812 Encounter for preprocedural laboratory examination: Secondary | ICD-10-CM

## 2023-08-12 HISTORY — DX: Generalized enlarged lymph nodes: R59.1

## 2023-08-12 HISTORY — DX: Essential (primary) hypertension: I10

## 2023-08-12 NOTE — Therapy (Signed)
OUTPATIENT SPEECH LANGUAGE PATHOLOGY  VOICE TREATMENT  Patient Name: Tara [name] MRN: [id number] DOB:1966/06/20, 57 y.o., female Today's Date: 08/12/2023  PCP: Dr. Charlton Haws  REFERRING PROVIDER: Dr. Linus Salmons      End of Session - 08/12/23 1503     Visit Number 12    Number of Visits 24    Date for SLP Re-Evaluation 09/27/23    Progress Note Due on Visit 20    SLP Start Time 1453    SLP Stop Time  1500    SLP Time Calculation (min) 7 min    Activity Tolerance --   treatment deferred due to pt's PCP appointment at 1515            Past Medical History:  Diagnosis Date   Breast lump in female    rt side 10 oclock   Elevated BP    Endometriosis of intestine    Sigmoid colon polyp   Essential hypertension    Fibroid    h/o   Lymphadenopathy    Night sweats    Perimenopausal    Vaginal Pap smear, abnormal    ascus.pos   Wears contact lenses    Past Surgical History:  Procedure Laterality Date   ABDOMINAL HYSTERECTOMY  2008   lsh/rso   BREAST EXCISIONAL BIOPSY Right    benign   BREAST SURGERY Right    lumpectomy    COLONOSCOPY WITH PROPOFOL N/A 08/30/2015   Procedure: COLONOSCOPY WITH PROPOFOL;  Surgeon: Midge Minium, MD;  Location: [facility];  Service: Endoscopy;  Laterality: N/A;   OOPHORECTOMY Right    POLYPECTOMY  08/30/2015   Procedure: POLYPECTOMY;  Surgeon: Midge Minium, MD;  Location: [facility];  Service: Endoscopy;;   Patient Active Problem List   Diagnosis Date Noted   Fatigue 08/11/2018   BV (bacterial vaginosis) 12/22/2016   Special screening for malignant neoplasms, colon    Benign neoplasm of ascending colon    Benign neoplasm of transverse colon    Benign neoplasm of sigmoid colon    Elevated blood pressure 07/31/2015   History of hysterectomy, supracervical 07/31/2015    ONSET DATE:  referral date 06/16/23; late June 2024 onset per pt report   REFERRING DIAG: dysphonia  THERAPY DIAG:   Dysphonia  Rationale for Evaluation and Treatment Rehabilitation  SUBJECTIVE:   SUBJECTIVE STATEMENT: Pt alert, pleasant, and cooperative. Endorsed having PCP establishment appointment at 1515.  Pt accompanied by: self  PERTINENT HISTORY: 57 y.o. female referred by Dr. Jenne Campus for chief complaint of a sudden onset of mild, intermittent hoarseness which started in June 2024.   DIAGNOSTIC FINDINGS: Laryngoscopy on 06/15/23 noted, paramedian L vocal cord paralysis with "good compensation from the R cord." CT chest, 07/22/23, "1. Numerous enlarged mediastinal lymph nodes. 2. Multiple small bilateral pulmonary nodules. 3. Bandlike scarring and volume loss of the right middle lobe. 4. Findings are concerning for malignancy or metastatic disease, however could reflect benign infectious or inflammatory etiology such as granulomatous infection or sarcoidosis. PET-CT may be helpful to assess for abnormal metabolic activity. Consider tissue sampling. 5. Cardiomegaly." CT soft tissue neck, 07/22/23, "1. Findings compatible with left vocal cord paralysis. No dominant neck mass. 2. Mildly enlarged left supraclavicular lymph nodes, indeterminate. Please see the separate chest CT report for thoracic findings and additional recommendations." Head CT, 07/22/23, "negative head CT."  PAIN:  Are you having pain? No   FALLS: Has patient fallen in last 6 months? No, Number of falls: 0  LIVING  ENVIRONMENT: Lives with: lives alone   PLOF: Independent  PATIENT GOALS    to improve voice  OBJECTIVE:  Today's Treatment: Deferred due to PCP appointment Reviewed HEP with pt   PATIENT EDUCATION: Education details: as above Person educated: Patient Education method: Explanation Education comprehension: verbalized understanding; needs reinforcement   HOME EXERCISE PROGRAM: Resonant voice therapy ex's, breathing ex's, and stretches Phonation resistance training ex's    GOALS: Goals  reviewed with patient? Yes  SHORT TERM GOALS: Target date: 10 sessions  The patient will demonstrate abdominal breathing patterns and steady release of breath on exhalation to optimize efficiency of voicing and decrease laryngeal hyperfunction.   Baseline: Goal status: MET  2.  Patient will ID x3 strategies to improve vocal quality, vocal hygiene, voice projection, and prevent vocal fatigue.    Baseline:  Goal status: MET  3.  The patient will utilize a forward tone focused/resonant voice to decrease vocal hyperfunction and improve voice quality and vocal projection.  Baseline:  Goal status: MET  4.  The patient will participate in 5-8 minutes conversation, maintaining average loudness of 75 dB and loud, good quality voice with min cues.      Baseline:  Goal status: IN PROGRESS  5.  The patient will decrease laryngeal and articulatory muscle tension by independently completing relaxation/stretching exercises with min cueing.   Baseline:  Goal status: MET   LONG TERM GOALS: Target date: 12 weeks  Patient will report improved communication effectiveness as measured by PROM  Baseline: VHI 23/120 Goal status: IN PROGRESS  2.  The patient will participate in 15-20 minutes conversation, maintaining average loudness of 75 dB and loud, good quality voice.   Baseline:  Goal status: IN PROGRESS  3.  The patient will decrease laryngeal and articulatory muscle tension by independently completing relaxation/stretching exercises independently.  Baseline:  Goal status: MET   ASSESSMENT:  CLINICAL IMPRESSION: Patient is a 57 y.o. female who was seen today for voice evaluation in setting of L vocal cord paralysis (confirmed by ENT in September 2024 via laryngoscopy). Patient presents with dysphonia characterized by perceptual features of hoarse, vocal quality, intermittently low vocal intensity and low pitch, and pitch breaks. Pt with tendency to utilize clavicular breathing with  phonation/effort. Pt is motivated to improve vocal quality. See above for details of tx session. I recommend skilled ST to improve vocal quality, endurance, and vocal hygiene to meet other vocal demands of work and home.   OBJECTIVE IMPAIRMENTS include voice disorder. These impairments are limiting patient from effectively communicating at home and in community. Factors affecting potential to achieve goals and functional outcome are medical prognosis (paralyzed vocal cord; further medical work up pending). Patient will benefit from skilled SLP services to address above impairments and improve overall function.  REHAB POTENTIAL: Good  PLAN: SLP FREQUENCY: 1-2x/week  SLP DURATION: 12 weeks  PLANNED INTERVENTIONS: Environmental controls, Cueing hierachy, Internal/external aids, Functional tasks, Multimodal communication approach, SLP instruction and feedback, Compensatory strategies, Patient/family education, and Re-evaluation    Clyde Canterbury, M.S., CCC-SLP Speech-Language Pathologist [place] [facility] [phone] Arnette Felts)  [place] [facility] Rehabilitation at [facility] [address] [place], [place], [address] Phone: [phone]   Fax:  [phone]

## 2023-08-12 NOTE — Patient Instructions (Signed)
Your procedure is scheduled on: Friday, November 22 Report to the Registration Desk on the 1st floor of the [facility] To find out your arrival time, please call [phone] between 1PM - 3PM on: Thursday, November 21 If your arrival time is 6:00 am, do not arrive before that time as the [facility] entrance doors do not open until 6:00 am.  REMEMBER: Instructions that are not followed completely may result in serious medical risk, up to and including death; or upon the discretion of your surgeon and anesthesiologist your surgery may need to be rescheduled.  Do not eat food after midnight the night before surgery.  No gum chewing or hard candies.  You may however, drink CLEAR liquids up to 2 hours before you are scheduled to arrive for your surgery. Do not drink anything within 2 hours of your scheduled arrival time.  Clear liquids include: - water  - apple juice without pulp - gatorade (not RED colors) - black coffee or tea (Do NOT add milk or creamers to the coffee or tea) Do NOT drink anything that is not on this list.  One week prior to surgery: starting November 15 Stop aspirin and Anti-inflammatories (NSAIDS) such as Advil, Aleve, Ibuprofen, Motrin, Naproxen, Naprosyn and Aspirin based products such as Excedrin, Goody's Powder, BC Powder. Stop ANY OVER THE COUNTER supplements until after surgery. Stop probiotic, vitamin D, multiple vitamin.  You may however, continue to take Tylenol if needed for pain up until the day of surgery.  Continue taking all of your other prescription medications up until the day of surgery.  If you get started on new blood pressure medication, do not take it the DAY of surgery if it is ACE INHIBITOR OR ANGIOTENSIN II RECEPTOR BLOCKER OR DIURETIC.  DO NOT TAKE ANY MEDICATION ON THE DAY OF SURGERY UNLESS YOU ARE STARTED ON A NEW BLOOD PRESSURE MEDICATION THAT IS NOT ONE OF THE ABOVE. ASK DR. KALISETTI TODAY DURING YOUR VISIT.  No Alcohol for 24  hours before or after surgery.  No Smoking including e-cigarettes for 24 hours before surgery.  No chewable tobacco products for at least 6 hours before surgery.  No nicotine patches on the day of surgery.  Do not use any "recreational" drugs for at least a week (preferably 2 weeks) before your surgery.  Please be advised that the combination of cocaine and anesthesia may have negative outcomes, up to and including death. If you test positive for cocaine, your surgery will be cancelled.  On the morning of surgery brush your teeth with toothpaste and water, you may rinse your mouth with mouthwash if you wish. Do not swallow any toothpaste or mouthwash.  Do not wear jewelry, make-up, hairpins, clips or nail polish.  For welded (permanent) jewelry: bracelets, anklets, waist bands, etc.  Please have this removed prior to surgery.  If it is not removed, there is a chance that hospital personnel will need to cut it off on the day of surgery.  Do not wear lotions, powders, or perfumes.   Do not shave body hair from the neck down 48 hours before surgery.  Contact lenses, hearing aids and dentures may not be worn into surgery.  Do not bring valuables to the hospital. [facility] is not responsible for any missing/lost belongings or valuables.   Notify your doctor if there is any change in your medical condition (cold, fever, infection).  Wear comfortable clothing (specific to your surgery type) to the hospital.  After surgery, you  can help prevent lung complications by doing breathing exercises.  Take deep breaths and cough every 1-2 hours.   If you are being discharged the day of surgery, you will not be allowed to drive home. You will need a responsible individual to drive you home and stay with you for 24 hours after surgery.   If you are taking public transportation, you will need to have a responsible individual with you.  Please call the Pre-admissions Testing Dept. at [phone] if you have any questions about these instructions.  Surgery Visitation Policy:  Patients having surgery or a procedure may have two visitors.  Children under the age of 37 must have an adult with them who is not the patient.

## 2023-08-16 ENCOUNTER — Ambulatory Visit: Payer: 59

## 2023-08-16 DIAGNOSIS — R490 Dysphonia: Secondary | ICD-10-CM | POA: Diagnosis not present

## 2023-08-16 NOTE — Therapy (Signed)
OUTPATIENT SPEECH LANGUAGE PATHOLOGY  VOICE TREATMENT  Patient Name: Tara [name] MRN: [id number] DOB:January 06, 1966, 57 y.o., female Today's Date: 08/16/2023  PCP: Dr. Charlton Haws  REFERRING PROVIDER: Dr. Linus Salmons      End of Session - 08/16/23 1537     Visit Number 13    Number of Visits 24    Date for SLP Re-Evaluation 09/27/23    Progress Note Due on Visit 20    SLP Start Time 1530    SLP Stop Time  1615    SLP Time Calculation (min) 45 min    Activity Tolerance Patient tolerated treatment well             Past Medical History:  Diagnosis Date   Breast lump in female    rt side 10 oclock   Elevated BP    Endometriosis of intestine    Sigmoid colon polyp   Essential hypertension    Fibroid    h/o   Lymphadenopathy    Night sweats    Perimenopausal    Vaginal Pap smear, abnormal    ascus.pos   Wears contact lenses    Past Surgical History:  Procedure Laterality Date   ABDOMINAL HYSTERECTOMY  2008   lsh/rso   BREAST EXCISIONAL BIOPSY Right    benign   BREAST SURGERY Right    lumpectomy    COLONOSCOPY WITH PROPOFOL N/A 08/30/2015   Procedure: COLONOSCOPY WITH PROPOFOL;  Surgeon: Midge Minium, MD;  Location: [facility];  Service: Endoscopy;  Laterality: N/A;   OOPHORECTOMY Right    POLYPECTOMY  08/30/2015   Procedure: POLYPECTOMY;  Surgeon: Midge Minium, MD;  Location: [facility];  Service: Endoscopy;;   Patient Active Problem List   Diagnosis Date Noted   Fatigue 08/11/2018   BV (bacterial vaginosis) 12/22/2016   Special screening for malignant neoplasms, colon    Benign neoplasm of ascending colon    Benign neoplasm of transverse colon    Benign neoplasm of sigmoid colon    Elevated blood pressure 07/31/2015   History of hysterectomy, supracervical 07/31/2015    ONSET DATE:  referral date 06/16/23; late June 2024 onset per pt report   REFERRING DIAG: dysphonia  THERAPY DIAG:  Dysphonia  Rationale for Evaluation and  Treatment Rehabilitation  SUBJECTIVE:   SUBJECTIVE STATEMENT: Pt alert, pleasant, and cooperative. Met with PCP and pre-op. Pending bronchoscopy on Friday.  Pt accompanied by: self  PERTINENT HISTORY: 57 y.o. female referred by Dr. Jenne Campus for chief complaint of a sudden onset of mild, intermittent hoarseness which started in June 2024.   DIAGNOSTIC FINDINGS: Laryngoscopy on 06/15/23 noted, paramedian L vocal cord paralysis with "good compensation from the R cord." CT chest, 07/22/23, "1. Numerous enlarged mediastinal lymph nodes. 2. Multiple small bilateral pulmonary nodules. 3. Bandlike scarring and volume loss of the right middle lobe. 4. Findings are concerning for malignancy or metastatic disease, however could reflect benign infectious or inflammatory etiology such as granulomatous infection or sarcoidosis. PET-CT may be helpful to assess for abnormal metabolic activity. Consider tissue sampling. 5. Cardiomegaly." CT soft tissue neck, 07/22/23, "1. Findings compatible with left vocal cord paralysis. No dominant neck mass. 2. Mildly enlarged left supraclavicular lymph nodes, indeterminate. Please see the separate chest CT report for thoracic findings and additional recommendations." Head CT, 07/22/23, "negative head CT."  PAIN:  Are you having pain? No   FALLS: Has patient fallen in last 6 months? No, Number of falls: 0  LIVING ENVIRONMENT: Lives with: lives alone  PLOF: Independent  PATIENT GOALS    to improve voice  OBJECTIVE:  Today's Treatment: Reviewed phonation resistance training ex's. Pt participated in phonation resistance training ex's as follows:  Pt completed x10 sustained /a/ - average 75 db, 10s; min verbal cues Pt completed ascending pitch glides on /ah/ - average 81 db, 7-8s; min verbal cues Pt completed descending pitch glides on /ah/ - average  dB, 7-8s; min verbal verbal cues, including direct modeling, for a strong clear voice; difficulty with  maintaining clear voice with lower pitch Pt repeated functional phrases in both a loud, higher pitch voice and strong, authoritative voice; mod verbal cues. Pt averaged 78-80 dB. Pt able to sustain adequate loudness with intermittently hoarse vocal quality during conversation (~5 minutes) with use of biofeedback (Voice Analyst App).    PATIENT EDUCATION: Education details: as above Person educated: Patient Education method: Explanation Education comprehension: verbalized understanding; needs reinforcement   HOME EXERCISE PROGRAM: Resonant voice therapy ex's, breathing ex's, and stretches Phonation resistance training ex's    GOALS: Goals reviewed with patient? Yes  SHORT TERM GOALS: Target date: 10 sessions  The patient will demonstrate abdominal breathing patterns and steady release of breath on exhalation to optimize efficiency of voicing and decrease laryngeal hyperfunction.   Baseline: Goal status: MET  2.  Patient will ID x3 strategies to improve vocal quality, vocal hygiene, voice projection, and prevent vocal fatigue.    Baseline:  Goal status: MET  3.  The patient will utilize a forward tone focused/resonant voice to decrease vocal hyperfunction and improve voice quality and vocal projection.  Baseline:  Goal status: MET  4.  The patient will participate in 5-8 minutes conversation, maintaining average loudness of 75 dB and loud, good quality voice with min cues.      Baseline:  Goal status: IN PROGRESS  5.  The patient will decrease laryngeal and articulatory muscle tension by independently completing relaxation/stretching exercises with min cueing.   Baseline:  Goal status: MET   LONG TERM GOALS: Target date: 12 weeks  Patient will report improved communication effectiveness as measured by PROM  Baseline: VHI 23/120 Goal status: IN PROGRESS  2.  The patient will participate in 15-20 minutes conversation, maintaining average loudness of 75 dB and loud,  good quality voice.   Baseline:  Goal status: IN PROGRESS  3.  The patient will decrease laryngeal and articulatory muscle tension by independently completing relaxation/stretching exercises independently.  Baseline:  Goal status: MET   ASSESSMENT:  CLINICAL IMPRESSION: Patient is a 57 y.o. female who was seen today for voice evaluation in setting of L vocal cord paralysis (confirmed by ENT in September 2024 via laryngoscopy). Patient presents with dysphonia characterized by perceptual features of hoarse, vocal quality, intermittently low vocal intensity and low pitch, and pitch breaks. Pt with tendency to utilize clavicular breathing with phonation/effort. Pt is motivated to improve vocal quality. See above for details of tx session. I recommend skilled ST to improve vocal quality, endurance, and vocal hygiene to meet other vocal demands of work and home.   OBJECTIVE IMPAIRMENTS include voice disorder. These impairments are limiting patient from effectively communicating at home and in community. Factors affecting potential to achieve goals and functional outcome are medical prognosis (paralyzed vocal cord; further medical work up pending). Patient will benefit from skilled SLP services to address above impairments and improve overall function.  REHAB POTENTIAL: Good  PLAN: SLP FREQUENCY: 1-2x/week  SLP DURATION: 12 weeks  PLANNED INTERVENTIONS: Environmental controls, Cueing hierachy,  Internal/external aids, Functional tasks, Multimodal communication approach, SLP instruction and feedback, Compensatory strategies, Patient/family education, and Re-evaluation    Clyde Canterbury, M.S., CCC-SLP Speech-Language Pathologist [facility] [phone] Arnette Felts)  [facility] at [facility] [address] [place], [place], [address] Phone: [phone]   Fax:  [phone]

## 2023-08-19 MED ORDER — LACTATED RINGERS IV SOLN: INTRAVENOUS | Status: DC

## 2023-08-19 MED ORDER — ORAL CARE MOUTH RINSE: 15.0000 mL | Freq: Once | OROMUCOSAL | Status: DC

## 2023-08-19 MED ORDER — CHLORHEXIDINE GLUCONATE 0.12 % MT SOLN: 15.0000 mL | Freq: Once | OROMUCOSAL | Status: DC

## 2023-08-20 ENCOUNTER — Ambulatory Visit: Payer: 59 | Admitting: Urgent Care

## 2023-08-20 ENCOUNTER — Other Ambulatory Visit: Payer: Self-pay

## 2023-08-20 ENCOUNTER — Ambulatory Visit: Payer: 59

## 2023-08-20 ENCOUNTER — Ambulatory Visit
Admission: RE | Admit: 2023-08-20 | Discharge: 2023-08-20 | Disposition: A | Discharge status: Home / Self Care | Payer: 59 | Attending: Pulmonary Disease | Admitting: Pulmonary Disease

## 2023-08-20 ENCOUNTER — Encounter
Admission: RE | Disposition: A | Discharge status: Home / Self Care | Payer: Self-pay | Source: Home / Self Care | Attending: Pulmonary Disease

## 2023-08-20 DIAGNOSIS — R911 Solitary pulmonary nodule: Secondary | ICD-10-CM | POA: Insufficient documentation

## 2023-08-20 DIAGNOSIS — I10 Essential (primary) hypertension: Secondary | ICD-10-CM | POA: Insufficient documentation

## 2023-08-20 DIAGNOSIS — Z79899 Other long term (current) drug therapy: Secondary | ICD-10-CM | POA: Insufficient documentation

## 2023-08-20 DIAGNOSIS — Z01812 Encounter for preprocedural laboratory examination: Secondary | ICD-10-CM

## 2023-08-20 DIAGNOSIS — Z86018 Personal history of other benign neoplasm: Secondary | ICD-10-CM | POA: Insufficient documentation

## 2023-08-20 DIAGNOSIS — J3800 Paralysis of vocal cords and larynx, unspecified: Secondary | ICD-10-CM | POA: Diagnosis not present

## 2023-08-20 DIAGNOSIS — T502X5A Adverse effect of carbonic-anhydrase inhibitors, benzothiadiazides and other diuretics, initial encounter: Secondary | ICD-10-CM

## 2023-08-20 DIAGNOSIS — R590 Localized enlarged lymph nodes: Secondary | ICD-10-CM | POA: Diagnosis not present

## 2023-08-20 DIAGNOSIS — Z90710 Acquired absence of both cervix and uterus: Secondary | ICD-10-CM | POA: Diagnosis not present

## 2023-08-20 HISTORY — PX: FLEXIBLE BRONCHOSCOPY: SHX5094

## 2023-08-20 HISTORY — PX: VIDEO BRONCHOSCOPY WITH ENDOBRONCHIAL ULTRASOUND: SHX6177

## 2023-08-20 SURGERY — BRONCHOSCOPY, WITH EBUS
Anesthesia: General

## 2023-08-20 MED ORDER — MIDAZOLAM HCL 2 MG/2ML IJ SOLN
INTRAMUSCULAR | Status: AC
  Filled 2023-08-20: qty 2

## 2023-08-20 MED ORDER — EPHEDRINE SULFATE (PRESSORS) 50 MG/ML IJ SOLN
INTRAMUSCULAR | Status: DC | PRN
  Administered 2023-08-20: 5 mg via INTRAVENOUS

## 2023-08-20 MED ORDER — EPHEDRINE 5 MG/ML INJ
INTRAVENOUS | Status: AC
  Filled 2023-08-20: qty 5

## 2023-08-20 MED ORDER — ONDANSETRON HCL 4 MG/2ML IJ SOLN
INTRAMUSCULAR | Status: DC | PRN
  Administered 2023-08-20: 4 mg via INTRAVENOUS

## 2023-08-20 MED ORDER — ROCURONIUM BROMIDE 100 MG/10ML IV SOLN
INTRAVENOUS | Status: DC | PRN
  Administered 2023-08-20: 20 mg via INTRAVENOUS
  Administered 2023-08-20: 40 mg via INTRAVENOUS

## 2023-08-20 MED ORDER — OXYCODONE HCL 5 MG PO TABS: 5.0000 mg | ORAL_TABLET | Freq: Once | ORAL | Status: DC | PRN

## 2023-08-20 MED ORDER — FENTANYL CITRATE (PF) 100 MCG/2ML IJ SOLN
INTRAMUSCULAR | Status: AC
  Filled 2023-08-20: qty 2

## 2023-08-20 MED ORDER — OXYCODONE HCL 5 MG/5ML PO SOLN: 5.0000 mg | Freq: Once | ORAL | Status: DC | PRN

## 2023-08-20 MED ORDER — DEXAMETHASONE SODIUM PHOSPHATE 10 MG/ML IJ SOLN
INTRAMUSCULAR | Status: DC | PRN
  Administered 2023-08-20: 10 mg via INTRAVENOUS

## 2023-08-20 MED ORDER — ONDANSETRON HCL 4 MG/2ML IJ SOLN
INTRAMUSCULAR | Status: AC
  Filled 2023-08-20: qty 2

## 2023-08-20 MED ORDER — MIDAZOLAM HCL 2 MG/2ML IJ SOLN
INTRAMUSCULAR | Status: DC | PRN
  Administered 2023-08-20: 2 mg via INTRAVENOUS

## 2023-08-20 MED ORDER — GLYCOPYRROLATE 0.2 MG/ML IJ SOLN
INTRAMUSCULAR | Status: DC | PRN
  Administered 2023-08-20: .2 mg via INTRAVENOUS

## 2023-08-20 MED ORDER — LIDOCAINE HCL (CARDIAC) PF 100 MG/5ML IV SOSY
PREFILLED_SYRINGE | INTRAVENOUS | Status: DC | PRN
  Administered 2023-08-20: 100 mg via INTRAVENOUS

## 2023-08-20 MED ORDER — DEXAMETHASONE SODIUM PHOSPHATE 10 MG/ML IJ SOLN
INTRAMUSCULAR | Status: AC
  Filled 2023-08-20: qty 1

## 2023-08-20 MED ORDER — DEXMEDETOMIDINE HCL IN NACL 80 MCG/20ML IV SOLN
INTRAVENOUS | Status: DC | PRN
  Administered 2023-08-20 (×2): 8 ug via INTRAVENOUS

## 2023-08-20 MED ORDER — ONDANSETRON HCL 4 MG/2ML IJ SOLN
4.0000 mg | Freq: Once | INTRAMUSCULAR | Status: DC | PRN
Start: 2023-08-20 — End: 2023-08-20

## 2023-08-20 MED ORDER — SUGAMMADEX SODIUM 200 MG/2ML IV SOLN
INTRAVENOUS | Status: DC | PRN
  Administered 2023-08-20: 200 mg via INTRAVENOUS

## 2023-08-20 MED ORDER — FENTANYL CITRATE (PF) 100 MCG/2ML IJ SOLN: 25.0000 ug | INTRAMUSCULAR | Status: DC | PRN

## 2023-08-20 MED ORDER — FENTANYL CITRATE (PF) 100 MCG/2ML IJ SOLN
INTRAMUSCULAR | Status: DC | PRN
  Administered 2023-08-20 (×2): 25 ug via INTRAVENOUS
  Administered 2023-08-20: 50 ug via INTRAVENOUS

## 2023-08-20 MED ORDER — PROPOFOL 10 MG/ML IV BOLUS
INTRAVENOUS | Status: DC | PRN
  Administered 2023-08-20: 150 mg via INTRAVENOUS

## 2023-08-20 NOTE — Anesthesia Preprocedure Evaluation (Addendum)
Anesthesia Evaluation  Patient identified by MRN, date of birth, ID band Patient awake    Reviewed: Allergy & Precautions, NPO status , Patient's Chart, lab work & pertinent test results  Airway Mallampati: I  TM Distance: >3 FB Neck ROM: Full    Dental  (+) Teeth Intact   Pulmonary neg pulmonary ROS L vocal cord is paralized.   Pulmonary exam normal breath sounds clear to auscultation       Cardiovascular hypertension, Pt. on medications negative cardio ROS Normal cardiovascular exam Rhythm:Regular Rate:Normal     Neuro/Psych negative neurological ROS  negative psych ROS   GI/Hepatic negative GI ROS, Neg liver ROS,,,  Endo/Other  negative endocrine ROS    Renal/GU negative Renal ROS  negative genitourinary   Musculoskeletal   Abdominal Normal abdominal exam  (+)   Peds negative pediatric ROS (+)  Hematology negative hematology ROS (+)   Anesthesia Other Findings Past Medical History: No date: Breast lump in female     Comment:  rt side 10 oclock No date: Elevated BP No date: Endometriosis of intestine     Comment:  Sigmoid colon polyp No date: Essential hypertension No date: Fibroid     Comment:  h/o No date: Lymphadenopathy No date: Night sweats No date: Perimenopausal No date: Vaginal Pap smear, abnormal     Comment:  ascus.pos No date: Wears contact lenses  Past Surgical History: 2008: ABDOMINAL HYSTERECTOMY     Comment:  lsh/rso No date: BREAST EXCISIONAL BIOPSY; Right     Comment:  benign No date: BREAST SURGERY; Right     Comment:  lumpectomy  08/30/2015: COLONOSCOPY WITH PROPOFOL; N/A     Comment:  Procedure: COLONOSCOPY WITH PROPOFOL;  Surgeon: Midge Minium, MD;  Location: [facility];  Service:               Endoscopy;  Laterality: N/A; No date: OOPHORECTOMY; Right 08/30/2015: POLYPECTOMY     Comment:  Procedure: POLYPECTOMY;  Surgeon: Midge Minium, MD;                 Location: [facility];  Service: Endoscopy;;  BMI    Body Mass Index: 29.28 kg/m      Reproductive/Obstetrics negative OB ROS                             Anesthesia Physical Anesthesia Plan  ASA: 2  Anesthesia Plan: General   Post-op Pain Management:    Induction: Intravenous  PONV Risk Score and Plan: Ondansetron, Dexamethasone, Midazolam and Treatment may vary due to age or medical condition  Airway Management Planned: Oral ETT  Additional Equipment:   Intra-op Plan:   Post-operative Plan: Extubation in OR  Informed Consent: I have reviewed the patients History and Physical, chart, labs and discussed the procedure including the risks, benefits and alternatives for the proposed anesthesia with the patient or authorized representative who has indicated his/her understanding and acceptance.     Dental Advisory Given  Plan Discussed with: CRNA and Surgeon  Anesthesia Plan Comments:        Anesthesia Quick Evaluation

## 2023-08-20 NOTE — Anesthesia Postprocedure Evaluation (Signed)
Anesthesia Post Note  Patient: Tara [name]  Procedure(s) Performed: VIDEO BRONCHOSCOPY WITH ENDOBRONCHIAL ULTRASOUND FLEXIBLE BRONCHOSCOPY  Patient location during evaluation: PACU Anesthesia Type: General Level of consciousness: awake and awake and alert Pain management: pain level controlled Respiratory status: spontaneous breathing Cardiovascular status: stable Anesthetic complications: no   No notable events documented.   Last Vitals:  Vitals:   08/20/23 1415 08/20/23 1430  BP: (!) 144/83 (!) 147/74  Pulse: 99 79  Resp: 16 17  Temp:  (!) 36.1 C  SpO2: 100% 100%    Last Pain:  Vitals:   08/20/23 1415  TempSrc:   PainSc: 0-No pain                 VAN STAVEREN,Chelse Matas

## 2023-08-20 NOTE — Procedures (Signed)
ROBOTIC NAVIGATIONAL BRONCHOSCOPY PROCEDURE NOTE  FIBEROPTIC BRONCHOSCOPY WITH BRONCHOALVEOLAR LAVAGE PROCEDURE NOTE  ENDOBRONCHIAL ULTRASOUND PROCEDURE >3 LYMPH NODES NOTE    Flexible bronchoscopy was performed  by : Karna Christmas MD  assistance by : 1)Repiratory therapist  and 2) cytotech staff and 3) Anesthesia team and 4) Flouroscopy team and 5) [facility]ach ROBOTIC bronchoscopy   Indication for the procedure was :  Pre-procedural H&P. The following assessment was performed on the day of the procedure prior to initiating sedation History:  Chest pain n Dyspnea y Hemoptysis n Cough y Fever n Other pertinent items n  Examination Vital signs -reviewed as per nursing documentation today Cardiac    Murmurs: n  Rubs : n  Gallop: n Lungs Wheezing: n Rales : n Rhonchi :y  Other pertinent findings: SOB/hypoxemia due to chronic lung disease   Pre-procedural assessment for Procedural Sedation included: Depth of sedation: As per anesthesia team  ASA Classification:  2 Mallampati airway assessment: 3    Medication list reviewed: y  The patient's interval history was taken and revealed: no new complaints The pre- procedure physical examination revealed: No new findings Refer to prior clinic note for details.  Informed Consent: Informed consent was obtained from:  patient after explanation of procedure and risks, benefits, as well as alternative procedures available.  Explanation of level of sedation and possible transfusion was also provided.    Procedural Preparation: Time out was performed and patient was identified by name and birthdate and procedure to be performed and side for sampling, if any, was specified. Pt was intubated by anesthesia.  The patient was appropriately draped.   Fiberoptic bronchoscopy with airway inspection and BAL Procedure findings:  Bronchoscope was inserted via ETT  without difficulty.  Posterior oropharynx, epiglottis, arytenoids, false cords  and vocal cords were not visualized as these were bypassed by endotracheal tube. The distal trachea was normal in circumference and appearance without mucosal, cartilaginous or branching abnormalities.  The main carina was mildly splayed . All right and left lobar airways were visualized to the Subsegmental level.  Sub- sub segmental carinae were identified in all the distal airways.   Secretions were visible in the following airways and appeared to be clear.  The mucosa was : friable at RUL  Airways were notable for:        exophytic lesions :n       extrinsic compression in the following distributions: n.       Friable mucosa: y       Teacher, music /pigmentation: n     Media Information  Document Information  COBBLESTONING OF MUCOSA SUGGESTIVE OF GRANULOMATOUS PROCESS   Post procedure Diagnosis:   COBBLESTONING OF MUCOSA, FRIABLE MUCOSA THROUGHOUT     ROBOTIC Navigational Bronchoscopy Procedure Findings:    Post appropriate planning and registration peripheral navigation was used to visualize target lesion.    BAL AT RUL FOR CYTOLOGY AND CELL COUNT WITH DIFF SURGICAL PATHOLOGY - ENDOBRONCHIAL X 4 -RIGHT UPPER LOBE  Post procedure diagnosis: LUNG NODULE BIOPSIED SENT TO CYTOLOGY       Endobronchial ultrasound assisted hilar and mediastinal lymph node biopsies procedure findings: The fiberoptic bronchoscope was removed and the EBUS scope was introduced.   All lymph node biopsies performed with 21G needle. Lymph node biopsies were sent in cytolite for all stations.  STATION 10L - 1.4CM - BIOPSIED 4 TIMES SENT FOR RPMI AND CYTOLOGY STATION 7 - 2.2 CM - BIOPSIED 4 TIMES SENT FOR RPMI AND CYTOLOGY  STATION 10R -  1.3CM - BIOPSIED 4 TIMES SNT FOR RPMI AND CYTOLOGY  Post procedure diagnosis:  LYMPHADENOPATHY OF HILAR AND MEDIASTINAL STATIONS    Specimens obtained included:    Broncho-alveolar lavage site:RUL  sent for CYTOLOGY AND CELL COUTN WITH DIFF                              30ml volume infused 15ml volume returned with CELLULAR appearance  Endobronchial biopsy site:  RUL; sent for CYTOLOGY                                 Fluoroscopy Used: YES ;        Pictorial documentation attached: YES                   Immediate sampling complications included:NONE  Epinephrine ZERO ml was used topically  The bronchoscopy was terminated due to completion of the planned procedure and the bronchoscope was removed.   Total dosage of Lidocaine was ZERO mg Total fluoroscopy time was AS PER RADIOLOGY  minutes  Supplemental oxygen was provided at AS PER ANESTHESIA lpm by nasal canula post operatively  Estimated Blood loss: EXPECTED <5cc.  Complications included:  NONE IMMEDIATE   Preliminary CXR findings :  IN PROCESS  Disposition: HOME WITH FAMILY   Follow up with Dr. Karna Christmas in 5 days for result discussion.     Vida Rigger MD  [facility] Division of Pulmonary & Critical Care Medicine

## 2023-08-20 NOTE — Transfer of Care (Signed)
Immediate Anesthesia Transfer of Care Note  Patient: Tara [name]  Procedure(s) Performed: Procedure(s): VIDEO BRONCHOSCOPY WITH ENDOBRONCHIAL ULTRASOUND (N/A) FLEXIBLE BRONCHOSCOPY (N/A)  Patient Location: PACU  Anesthesia Type:General  Level of Consciousness: sedated  Airway & Oxygen Therapy: Patient Spontanous Breathing and Patient connected to face mask oxygen  Post-op Assessment: Report given to RN and Post -op Vital signs reviewed and stable  Post vital signs: Reviewed and stable  Last Vitals:  Vitals:   08/20/23 1041 08/20/23 1407  BP: (!) 152/93 (!) 148/83  Pulse: 76 99  Resp: 18 (!) 4  Temp: (!) 36.4 C (!) 36.1 C  SpO2: 100% 100%    Complications: No apparent anesthesia complications

## 2023-08-20 NOTE — H&P (Signed)
PULMONOLOGY         Date: 08/20/2023,   MRN# [id number] Tara [name] 27-Sep-1966     AdmissionWeight: 84.8 kg                 CurrentWeight: 84.8 kg  Referring provider: Dr Jenne Campus   CHIEF COMPLAINT:   Right upper lobe nodule with hilar adenopathy   HISTORY OF PRESENT ILLNESS   This is a very pleasant 57 year old female with a history of fibroid uterus, essential hypertension, night sweats and lymphadenopathy recent onset of vocal cord paralysis.  She came in to outpatient clinic with findings of abnormal mediastinal and hilar adenopathy as well as a 9 mm right upper lobe lung nodule.  Today patient is here for evaluation for possible bronchogenic carcinoma with lung biopsy and lymph node biopsies.  Additional possibilities include inflammatory etiology such as pulmonary sarcoidosis.  Will plan on performing fiberoptic bronchoscopy with airway inspection, if there is heavy phlegm and mucous plugging we can perform therapeutic aspiration with flexible bronchoscopy as well as microbiology with bronchoalveolar lavage.  Will be utilizing Monarch platform for lung biopsy with robotic bronchoscopy.  Finally we will proceed with endobronchial ultrasound for lymph node staging/evaluation.   PAST MEDICAL HISTORY   Past Medical History:  Diagnosis Date   Breast lump in female    rt side 10 oclock   Elevated BP    Endometriosis of intestine    Sigmoid colon polyp   Essential hypertension    Fibroid    h/o   Lymphadenopathy    Night sweats    Perimenopausal    Vaginal Pap smear, abnormal    ascus.pos   Wears contact lenses      SURGICAL HISTORY   Past Surgical History:  Procedure Laterality Date   ABDOMINAL HYSTERECTOMY  2008   lsh/rso   BREAST EXCISIONAL BIOPSY Right    benign   BREAST SURGERY Right    lumpectomy    COLONOSCOPY WITH PROPOFOL N/A 08/30/2015   Procedure: COLONOSCOPY WITH PROPOFOL;  Surgeon: Midge Minium, MD;  Location: [facility];   Service: Endoscopy;  Laterality: N/A;   OOPHORECTOMY Right    POLYPECTOMY  08/30/2015   Procedure: POLYPECTOMY;  Surgeon: Midge Minium, MD;  Location: [facility];  Service: Endoscopy;;     FAMILY HISTORY   Family History  Problem Relation Age of Onset   Breast cancer Sister    Diabetes Neg Hx    Heart disease Neg Hx    Colon cancer Neg Hx    Ovarian cancer Neg Hx      SOCIAL HISTORY   Social History   Tobacco Use   Smoking status: Never   Smokeless tobacco: Never  Vaping Use   Vaping status: Never Used  Substance Use Topics   Alcohol use: Yes    Comment: rare   Drug use: No     MEDICATIONS    Home Medication:    Current Medication:  Current Facility-Administered Medications:    chlorhexidine (PERIDEX) 0.12 % solution 15 mL, 15 mL, Mouth/Throat, Once **OR** Oral care mouth rinse, 15 mL, Mouth Rinse, Once, Piscitello, Cleda Mccreedy, MD   lactated ringers infusion, , Intravenous, Continuous, Piscitello, Cleda Mccreedy, MD    ALLERGIES   Sulfa antibiotics     REVIEW OF SYSTEMS    Review of Systems:  Gen:  Denies  fever, sweats, chills weigh loss  HEENT: Denies blurred vision, double vision, ear pain, eye pain, hearing loss, nose bleeds,  sore throat Cardiac:  No dizziness, chest pain or heaviness, chest tightness,edema Resp:   reports dyspnea chronically  Gi: Denies swallowing difficulty, stomach pain, nausea or vomiting, diarrhea, constipation, bowel incontinence Gu:  Denies bladder incontinence, burning urine Ext:   Denies Joint pain, stiffness or swelling Skin: Denies  skin rash, easy bruising or bleeding or hives Endoc:  Denies polyuria, polydipsia , polyphagia or weight change Psych:   Denies depression, insomnia or hallucinations   Other:  All other systems negative   VS: BP (!) 152/93   Pulse 76   Temp (!) 97.5 F (36.4 C) (Temporal)   Resp 18   Ht 5\' 7"  (1.702 m)   Wt 84.8 kg   SpO2 100%   BMI 29.28 kg/m      PHYSICAL EXAM     GENERAL:NAD, no fevers, chills, no weakness no fatigue HEAD: Normocephalic, atraumatic.  EYES: Pupils equal, round, reactive to light. Extraocular muscles intact. No scleral icterus.  MOUTH: Moist mucosal membrane. Dentition intact. No abscess noted.  EAR, NOSE, THROAT: Clear without exudates. No external lesions.  NECK: Supple. No thyromegaly. No nodules. No JVD.  PULMONARY: decreased breath sounds with mild rhonchi worse at bases bilaterally.  CARDIOVASCULAR: S1 and S2. Regular rate and rhythm. No murmurs, rubs, or gallops. No edema. Pedal pulses 2+ bilaterally.  GASTROINTESTINAL: Soft, nontender, nondistended. No masses. Positive bowel sounds. No hepatosplenomegaly.  MUSCULOSKELETAL: No swelling, clubbing, or edema. Range of motion full in all extremities.  NEUROLOGIC: Cranial nerves II through XII are intact. No gross focal neurological deficits. Sensation intact. Reflexes intact.  SKIN: No ulceration, lesions, rashes, or cyanosis. Skin warm and dry. Turgor intact.  PSYCHIATRIC: Mood, affect within normal limits. The patient is awake, alert and oriented x 3. Insight, judgment intact.       IMAGING  9 mm right upper lobe nodule with hilar/mediastinal adenopathy noted on CT chest reviewed with patient  ASSESSMENT/PLAN   Right upper lobe nodule with hilar/mediastinal adenopathy          -Patient is here today with no new complaints for tissue biopsy of upper lobe lung nodule of the right lung as well as hilar and mediastinal lymphadenopathy.  Differential includes primary bronchogenic carcinoma versus possible infectious etiology versus inflammatory such as pulmonary sarcoidosis.  Will be performing flexible bronchoscopy for airway inspection, fiberoptic bronchoscopy with therapeutic aspiration of tracheobronchial tree, bronchoalveolar lavage and robotic bronchoscopy utilizing Monarch platform as well as endobronchial ultrasound for lymph node staging. Reviewed risks/complications and  benefits with patient, risks include infection, pneumothorax/pneumomediastinum which may require chest tube placement as well as overnight/prolonged hospitalization and possible mechanical ventilation. Other risks include bleeding and very rarely death.  Patient understands risks and wishes to proceed.  Additional questions were answered, and patient is aware that post procedure patient will be going home with family and may experience cough with possible clots on expectoration as well as phlegm which may last few days as well as hoarseness of voice post intubation and mechanical ventilation.            Thank you for allowing me to participate in the care of this patient.   Patient/Family are satisfied with care plan and all questions have been answered.    Provider disclosure: Patient with at least one acute or chronic illness or injury that poses a threat to life or bodily function and is being managed actively during this encounter.  All of the below services have been performed independently by signing provider:  review  of prior documentation from internal and or external health records.  Review of previous and current lab results.  Interview and comprehensive assessment during patient visit today. Review of current and previous chest radiographs/CT scans. Discussion of management and test interpretation with health care team and patient/family.   This document was prepared using Dragon voice recognition software and may include unintentional dictation errors.     Vida Rigger, M.D.  Division of Pulmonary & Critical Care Medicine

## 2023-08-20 NOTE — Anesthesia Procedure Notes (Signed)
Procedure Name: Intubation Date/Time: 08/20/2023 12:41 PM  Performed by: Stormy Fabian, CRNAPre-anesthesia Checklist: Patient identified, Patient being monitored, Timeout performed, Emergency Drugs available and Suction available Patient Re-evaluated:Patient Re-evaluated prior to induction Oxygen Delivery Method: Circle system utilized Preoxygenation: Pre-oxygenation with 100% oxygen Induction Type: IV induction Ventilation: Mask ventilation without difficulty Laryngoscope Size: Mac, 3 and McGrath Grade View: Grade I Tube type: Oral Tube size: 8.5 mm Number of attempts: 1 Airway Equipment and Method: Stylet Placement Confirmation: ETT inserted through vocal cords under direct vision, positive ETCO2 and breath sounds checked- equal and bilateral Secured at: 21 cm Tube secured with: Tape Dental Injury: Teeth and Oropharynx as per pre-operative assessment

## 2023-08-23 ENCOUNTER — Ambulatory Visit: Payer: 59

## 2023-08-23 ENCOUNTER — Encounter: Payer: Self-pay | Admitting: Pulmonary Disease

## 2023-08-23 DIAGNOSIS — R490 Dysphonia: Secondary | ICD-10-CM

## 2023-08-23 NOTE — Therapy (Signed)
OUTPATIENT SPEECH LANGUAGE PATHOLOGY  VOICE TREATMENT  Patient Name: Tara [name] MRN: [id number] DOB:[birth year], 57 y.o., female Today's Date: 08/23/2023  PCP: Dr. Charlton Haws  REFERRING PROVIDER: Dr. Linus Salmons      End of Session - 08/23/23 1628     Visit Number 14    Number of Visits 24    Date for SLP Re-Evaluation 09/27/23    SLP Start Time 1530    SLP Stop Time  1625    SLP Time Calculation (min) 55 min    Activity Tolerance Patient tolerated treatment well             Past Medical History:  Diagnosis Date   Breast lump in female    rt side 10 oclock   Elevated BP    Endometriosis of intestine    Sigmoid colon polyp   Essential hypertension    Fibroid    h/o   Lymphadenopathy    Night sweats    Perimenopausal    Vaginal Pap smear, abnormal    ascus.pos   Wears contact lenses    Past Surgical History:  Procedure Laterality Date   ABDOMINAL HYSTERECTOMY  2008   lsh/rso   BREAST EXCISIONAL BIOPSY Right    benign   BREAST SURGERY Right    lumpectomy    COLONOSCOPY WITH PROPOFOL N/A 08/30/2015   Procedure: COLONOSCOPY WITH PROPOFOL;  Surgeon: Midge Minium, MD;  Location: [facility];  Service: Endoscopy;  Laterality: N/A;   FLEXIBLE BRONCHOSCOPY N/A 08/20/2023   Procedure: FLEXIBLE BRONCHOSCOPY;  Surgeon: Vida Rigger, MD;  Location: [facility];  Service: Thoracic;  Laterality: N/A;   OOPHORECTOMY Right    POLYPECTOMY  08/30/2015   Procedure: POLYPECTOMY;  Surgeon: Midge Minium, MD;  Location: [facility];  Service: Endoscopy;;   VIDEO BRONCHOSCOPY WITH ENDOBRONCHIAL ULTRASOUND N/A 08/20/2023   Procedure: VIDEO BRONCHOSCOPY WITH ENDOBRONCHIAL ULTRASOUND;  Surgeon: Vida Rigger, MD;  Location: [facility];  Service: Thoracic;  Laterality: N/A;   Patient Active Problem List   Diagnosis Date Noted   Fatigue 08/11/2018   BV (bacterial vaginosis) 12/22/2016   Special screening for malignant neoplasms, colon    Benign neoplasm  of ascending colon    Benign neoplasm of transverse colon    Benign neoplasm of sigmoid colon    Elevated blood pressure 07/31/2015   History of hysterectomy, supracervical 07/31/2015    ONSET DATE:  referral date 06/16/23; late June 2024 onset per pt report   REFERRING DIAG: dysphonia  THERAPY DIAG:  Dysphonia  Rationale for Evaluation and Treatment Rehabilitation  SUBJECTIVE:   SUBJECTIVE STATEMENT: Pt alert, pleasant, and cooperative. Had bronchoscopy, awaiting results. Endorsed increased coughing lately.  Pt accompanied by: self  PERTINENT HISTORY: 57 y.o. female referred by Dr. Jenne Campus for chief complaint of a sudden onset of mild, intermittent hoarseness which started in June 2024.   DIAGNOSTIC FINDINGS: Laryngoscopy on 06/15/23 noted, paramedian L vocal cord paralysis with "good compensation from the R cord." CT chest, 07/22/23, "1. Numerous enlarged mediastinal lymph nodes. 2. Multiple small bilateral pulmonary nodules. 3. Bandlike scarring and volume loss of the right middle lobe. 4. Findings are concerning for malignancy or metastatic disease, however could reflect benign infectious or inflammatory etiology such as granulomatous infection or sarcoidosis. PET-CT may be helpful to assess for abnormal metabolic activity. Consider tissue sampling. 5. Cardiomegaly." CT soft tissue neck, 07/22/23, "1. Findings compatible with left vocal cord paralysis. No dominant neck mass. 2. Mildly enlarged left supraclavicular lymph nodes, indeterminate. Please  see the separate chest CT report for thoracic findings and additional recommendations." Head CT, 07/22/23, "negative head CT."  PAIN:  Are you having pain? No   FALLS: Has patient fallen in last 6 months? No, Number of falls: 0  LIVING ENVIRONMENT: Lives with: lives alone   PLOF: Independent  PATIENT GOALS    to improve voice  OBJECTIVE:  Today's Treatment: Introduced Semi-Occluded Voice Therapy Ex's including  rationale and benefit. Pt completed sustained phonation and pitch glides utilizing both a straw and coffee stirrer. Increased difficulty with pitch glides using coffee stirrer. Pt required min verbal cues to complete. Improving in vocal quality noted following SOVT.  Pt endorsed holding off on HEP over the weekend since bronch. HEP provided for SOVT.   PATIENT EDUCATION: Education details: as above Person educated: Patient Education method: Explanation Education comprehension: verbalized understanding; needs reinforcement   HOME EXERCISE PROGRAM: SOVT ex's    GOALS: Goals reviewed with patient? Yes  SHORT TERM GOALS: Target date: 10 sessions  The patient will demonstrate abdominal breathing patterns and steady release of breath on exhalation to optimize efficiency of voicing and decrease laryngeal hyperfunction.   Baseline: Goal status: MET  2.  Patient will ID x3 strategies to improve vocal quality, vocal hygiene, voice projection, and prevent vocal fatigue.    Baseline:  Goal status: MET  3.  The patient will utilize a forward tone focused/resonant voice to decrease vocal hyperfunction and improve voice quality and vocal projection.  Baseline:  Goal status: MET  4.  The patient will participate in 5-8 minutes conversation, maintaining average loudness of 75 dB and loud, good quality voice with min cues.      Baseline:  Goal status: IN PROGRESS  5.  The patient will decrease laryngeal and articulatory muscle tension by independently completing relaxation/stretching exercises with min cueing.   Baseline:  Goal status: MET   LONG TERM GOALS: Target date: 12 weeks  Patient will report improved communication effectiveness as measured by PROM  Baseline: VHI 23/120 Goal status: IN PROGRESS  2.  The patient will participate in 15-20 minutes conversation, maintaining average loudness of 75 dB and loud, good quality voice.   Baseline:  Goal status: IN PROGRESS  3.  The  patient will decrease laryngeal and articulatory muscle tension by independently completing relaxation/stretching exercises independently.  Baseline:  Goal status: MET   ASSESSMENT:  CLINICAL IMPRESSION: Patient is a 57 y.o. female who was seen today for voice evaluation in setting of L vocal cord paralysis (confirmed by ENT in September 2024 via laryngoscopy). Patient presents with dysphonia characterized by perceptual features of hoarse, vocal quality, intermittently low vocal intensity and low pitch, and pitch breaks. Pt with tendency to utilize clavicular breathing with phonation/effort. Pt is motivated to improve vocal quality. See above for details of tx session. I recommend skilled ST to improve vocal quality, endurance, and vocal hygiene to meet other vocal demands of work and home.   OBJECTIVE IMPAIRMENTS include voice disorder. These impairments are limiting patient from effectively communicating at home and in community. Factors affecting potential to achieve goals and functional outcome are medical prognosis (paralyzed vocal cord; further medical work up pending). Patient will benefit from skilled SLP services to address above impairments and improve overall function.  REHAB POTENTIAL: Good  PLAN: SLP FREQUENCY: 1-2x/week  SLP DURATION: 12 weeks  PLANNED INTERVENTIONS: Environmental controls, Cueing hierachy, Internal/external aids, Functional tasks, Multimodal communication approach, SLP instruction and feedback, Compensatory strategies, Patient/family education, and Re-evaluation  Clyde Canterbury, M.S., CCC-SLP Speech-Language Pathologist [facility] [phone] Arnette Felts)  [facility] Outpatient Rehabilitation at [facility] [address] [place], [place], [address] Phone: [phone]   Fax:  [phone]

## 2023-08-24 LAB — CYTOLOGY - NON PAP

## 2023-08-24 LAB — SURGICAL PATHOLOGY

## 2023-08-30 ENCOUNTER — Ambulatory Visit: Payer: 59 | Attending: Unknown Physician Specialty

## 2023-08-30 DIAGNOSIS — R490 Dysphonia: Secondary | ICD-10-CM | POA: Diagnosis present

## 2023-08-30 NOTE — Therapy (Signed)
OUTPATIENT SPEECH LANGUAGE PATHOLOGY  VOICE TREATMENT  Patient Name: Tara [name] MRN: [id number] DOB:Sep 27, 1966, 57 y.o., female Today's Date: 08/30/2023  PCP: Dr. Charlton Haws  REFERRING PROVIDER: Dr. Linus Salmons      End of Session - 08/30/23 1630     Visit Number 15    Number of Visits 24    Date for SLP Re-Evaluation 09/27/23    Progress Note Due on Visit 20    SLP Start Time 0100    SLP Stop Time  1625    SLP Time Calculation (min) 925 min    Activity Tolerance Patient tolerated treatment well             Past Medical History:  Diagnosis Date   Breast lump in female    rt side 10 oclock   Elevated BP    Endometriosis of intestine    Sigmoid colon polyp   Essential hypertension    Fibroid    h/o   Lymphadenopathy    Night sweats    Perimenopausal    Vaginal Pap smear, abnormal    ascus.pos   Wears contact lenses    Past Surgical History:  Procedure Laterality Date   ABDOMINAL HYSTERECTOMY  2008   lsh/rso   BREAST EXCISIONAL BIOPSY Right    benign   BREAST SURGERY Right    lumpectomy    COLONOSCOPY WITH PROPOFOL N/A 08/30/2015   Procedure: COLONOSCOPY WITH PROPOFOL;  Surgeon: Midge Minium, MD;  Location: [facility];  Service: Endoscopy;  Laterality: N/A;   FLEXIBLE BRONCHOSCOPY N/A 08/20/2023   Procedure: FLEXIBLE BRONCHOSCOPY;  Surgeon: Vida Rigger, MD;  Location: [facility];  Service: Thoracic;  Laterality: N/A;   OOPHORECTOMY Right    POLYPECTOMY  08/30/2015   Procedure: POLYPECTOMY;  Surgeon: Midge Minium, MD;  Location: [facility];  Service: Endoscopy;;   VIDEO BRONCHOSCOPY WITH ENDOBRONCHIAL ULTRASOUND N/A 08/20/2023   Procedure: VIDEO BRONCHOSCOPY WITH ENDOBRONCHIAL ULTRASOUND;  Surgeon: Vida Rigger, MD;  Location: [facility];  Service: Thoracic;  Laterality: N/A;   Patient Active Problem List   Diagnosis Date Noted   Fatigue 08/11/2018   BV (bacterial vaginosis) 12/22/2016   Special screening for malignant  neoplasms, colon    Benign neoplasm of ascending colon    Benign neoplasm of transverse colon    Benign neoplasm of sigmoid colon    Elevated blood pressure 07/31/2015   History of hysterectomy, supracervical 07/31/2015    ONSET DATE:  referral date 06/16/23; late June 2024 onset per pt report   REFERRING DIAG: dysphonia  THERAPY DIAG:  Dysphonia  Rationale for Evaluation and Treatment Rehabilitation  SUBJECTIVE:   SUBJECTIVE STATEMENT: Pt alert, pleasant, and cooperative. Met with PCP and pre-op. Pending bronchoscopy on Friday.  Pt accompanied by: self  PERTINENT HISTORY: 57 y.o. female referred by Dr. Jenne Campus for chief complaint of a sudden onset of mild, intermittent hoarseness which started in June 2024.   DIAGNOSTIC FINDINGS: Laryngoscopy on 06/15/23 noted, paramedian L vocal cord paralysis with "good compensation from the R cord." CT chest, 07/22/23, "1. Numerous enlarged mediastinal lymph nodes. 2. Multiple small bilateral pulmonary nodules. 3. Bandlike scarring and volume loss of the right middle lobe. 4. Findings are concerning for malignancy or metastatic disease, however could reflect benign infectious or inflammatory etiology such as granulomatous infection or sarcoidosis. PET-CT may be helpful to assess for abnormal metabolic activity. Consider tissue sampling. 5. Cardiomegaly." CT soft tissue neck, 07/22/23, "1. Findings compatible with left vocal cord paralysis. No dominant neck  mass. 2. Mildly enlarged left supraclavicular lymph nodes, indeterminate. Please see the separate chest CT report for thoracic findings and additional recommendations." Head CT, 07/22/23, "negative head CT."  PAIN:  Are you having pain? No   FALLS: Has patient fallen in last 6 months? No, Number of falls: 0  LIVING ENVIRONMENT: Lives with: lives alone   PLOF: Independent  PATIENT GOALS    to improve voice  OBJECTIVE:  Today's Treatment: Reviewed different treatment  methods including PHORTE, Resonant Voice, clear speech, and SOVT.  Pt demonstrated Resonant Voice at the word and sentence level with min cues; intermittent hoarsness. Pt able to sustain adequate loudness with intermittently hoarse vocal quality during conversation (~5 minutes) with use of biofeedback (Voice Analyst App). Pt benefited from cues to increase loudness and pitch via use of clear speech.   PATIENT EDUCATION: Education details: as above Person educated: Patient Education method: Explanation Education comprehension: verbalized understanding; needs reinforcement   HOME EXERCISE PROGRAM: Resonant voice therapy ex's, breathing ex's, and stretches Phonation resistance training ex's    GOALS: Goals reviewed with patient? Yes  SHORT TERM GOALS: Target date: 10 sessions  The patient will demonstrate abdominal breathing patterns and steady release of breath on exhalation to optimize efficiency of voicing and decrease laryngeal hyperfunction.   Baseline: Goal status: MET  2.  Patient will ID x3 strategies to improve vocal quality, vocal hygiene, voice projection, and prevent vocal fatigue.    Baseline:  Goal status: MET  3.  The patient will utilize a forward tone focused/resonant voice to decrease vocal hyperfunction and improve voice quality and vocal projection.  Baseline:  Goal status: MET  4.  The patient will participate in 5-8 minutes conversation, maintaining average loudness of 75 dB and loud, good quality voice with min cues.      Baseline:  Goal status: IN PROGRESS  5.  The patient will decrease laryngeal and articulatory muscle tension by independently completing relaxation/stretching exercises with min cueing.   Baseline:  Goal status: MET   LONG TERM GOALS: Target date: 12 weeks  Patient will report improved communication effectiveness as measured by PROM  Baseline: VHI 23/120 Goal status: IN PROGRESS  2.  The patient will participate in 15-20  minutes conversation, maintaining average loudness of 75 dB and loud, good quality voice.   Baseline:  Goal status: IN PROGRESS  3.  The patient will decrease laryngeal and articulatory muscle tension by independently completing relaxation/stretching exercises independently.  Baseline:  Goal status: MET   ASSESSMENT:  CLINICAL IMPRESSION: Patient is a 57 y.o. female who was seen today for voice evaluation in setting of L vocal cord paralysis (confirmed by ENT in September 2024 via laryngoscopy). Patient presents with dysphonia characterized by perceptual features of hoarse, vocal quality, intermittently low vocal intensity and low pitch, and pitch breaks. Pt with tendency to utilize clavicular breathing with phonation/effort. Pt is motivated to improve vocal quality. See above for details of tx session. I recommend skilled ST to improve vocal quality, endurance, and vocal hygiene to meet other vocal demands of work and home.   OBJECTIVE IMPAIRMENTS include voice disorder. These impairments are limiting patient from effectively communicating at home and in community. Factors affecting potential to achieve goals and functional outcome are medical prognosis (paralyzed vocal cord; further medical work up pending). Patient will benefit from skilled SLP services to address above impairments and improve overall function.  REHAB POTENTIAL: Good  PLAN: SLP FREQUENCY: 1-2x/week  SLP DURATION: 12 weeks  PLANNED INTERVENTIONS:  Environmental controls, Economist, Internal/external aids, Functional tasks, Multimodal communication approach, SLP instruction and feedback, Compensatory strategies, Patient/family education, and Re-evaluation    Clyde Canterbury, M.S., CCC-SLP Speech-Language Pathologist [facility] [phone] Arnette Felts)  [facility] Outpatient Rehabilitation at [facility] [address] [place], [place], [address] Phone:  [phone]   Fax:  [phone]

## 2023-09-02 ENCOUNTER — Ambulatory Visit: Payer: 59

## 2023-09-02 DIAGNOSIS — R490 Dysphonia: Secondary | ICD-10-CM | POA: Diagnosis not present

## 2023-09-02 NOTE — Therapy (Signed)
OUTPATIENT SPEECH LANGUAGE PATHOLOGY  VOICE TREATMENT  Patient Name: Tara [name] MRN: [id number] DOB:02/28/1966, 57 y.o., female Today's Date: 09/02/2023  PCP: Dr. Charlton Haws  REFERRING PROVIDER: Dr. Linus Salmons      End of Session - 09/02/23 1625     Visit Number 16    Number of Visits 24    Date for SLP Re-Evaluation 09/27/23    Progress Note Due on Visit 20    SLP Start Time 1535    SLP Stop Time  1615    SLP Time Calculation (min) 40 min             Past Medical History:  Diagnosis Date   Breast lump in female    rt side 10 oclock   Elevated BP    Endometriosis of intestine    Sigmoid colon polyp   Essential hypertension    Fibroid    h/o   Lymphadenopathy    Night sweats    Perimenopausal    Vaginal Pap smear, abnormal    ascus.pos   Wears contact lenses    Past Surgical History:  Procedure Laterality Date   ABDOMINAL HYSTERECTOMY  2008   lsh/rso   BREAST EXCISIONAL BIOPSY Right    benign   BREAST SURGERY Right    lumpectomy    COLONOSCOPY WITH PROPOFOL N/A 08/30/2015   Procedure: COLONOSCOPY WITH PROPOFOL;  Surgeon: Midge Minium, MD;  Location: [facility];  Service: Endoscopy;  Laterality: N/A;   FLEXIBLE BRONCHOSCOPY N/A 08/20/2023   Procedure: FLEXIBLE BRONCHOSCOPY;  Surgeon: Vida Rigger, MD;  Location: [facility];  Service: Thoracic;  Laterality: N/A;   OOPHORECTOMY Right    POLYPECTOMY  08/30/2015   Procedure: POLYPECTOMY;  Surgeon: Midge Minium, MD;  Location: [facility];  Service: Endoscopy;;   VIDEO BRONCHOSCOPY WITH ENDOBRONCHIAL ULTRASOUND N/A 08/20/2023   Procedure: VIDEO BRONCHOSCOPY WITH ENDOBRONCHIAL ULTRASOUND;  Surgeon: Vida Rigger, MD;  Location: [facility];  Service: Thoracic;  Laterality: N/A;   Patient Active Problem List   Diagnosis Date Noted   Fatigue 08/11/2018   BV (bacterial vaginosis) 12/22/2016   Special screening for malignant neoplasms, colon    Benign neoplasm of ascending colon     Benign neoplasm of transverse colon    Benign neoplasm of sigmoid colon    Elevated blood pressure 07/31/2015   History of hysterectomy, supracervical 07/31/2015    ONSET DATE:  referral date 06/16/23; late June 2024 onset per pt report   REFERRING DIAG: dysphonia  THERAPY DIAG:  Dysphonia  Rationale for Evaluation and Treatment Rehabilitation  SUBJECTIVE:   SUBJECTIVE STATEMENT: Pt alert, pleasant, and cooperative. Met with PCP and pre-op. Pending bronchoscopy on Friday.  Pt accompanied by: self  PERTINENT HISTORY: 57 y.o. female referred by Dr. Jenne Campus for chief complaint of a sudden onset of mild, intermittent hoarseness which started in June 2024.   DIAGNOSTIC FINDINGS: Laryngoscopy on 06/15/23 noted, paramedian L vocal cord paralysis with "good compensation from the R cord." CT chest, 07/22/23, "1. Numerous enlarged mediastinal lymph nodes. 2. Multiple small bilateral pulmonary nodules. 3. Bandlike scarring and volume loss of the right middle lobe. 4. Findings are concerning for malignancy or metastatic disease, however could reflect benign infectious or inflammatory etiology such as granulomatous infection or sarcoidosis. PET-CT may be helpful to assess for abnormal metabolic activity. Consider tissue sampling. 5. Cardiomegaly." CT soft tissue neck, 07/22/23, "1. Findings compatible with left vocal cord paralysis. No dominant neck mass. 2. Mildly enlarged left supraclavicular lymph nodes, indeterminate.  Please see the separate chest CT report for thoracic findings and additional recommendations." Head CT, 07/22/23, "negative head CT."  PAIN:  Are you having pain? No   FALLS: Has patient fallen in last 6 months? No, Number of falls: 0  LIVING ENVIRONMENT: Lives with: lives alone   PLOF: Independent  PATIENT GOALS    to improve voice  OBJECTIVE:  Today's Treatment: Reviewed different treatment methods including PHORTE, Resonant Voice, clear speech, and SOVT.   Pt read PHORTE phrases with rare cues for loudness and pitch.  Pt able to sustain adequate loudness with intermittently hoarse vocal quality during conversation (~10 minutes) with use of biofeedback (Voice Analyst App). Pt benefited from cues to increase loudness and pitch via use of clear speech. Reviewed benefits of adequate hydration (both internal and external with pt). Encouraged pt to utilize humidifier at night while sleeping.    PATIENT EDUCATION: Education details: as above Person educated: Patient Education method: Explanation Education comprehension: verbalized understanding; needs reinforcement   HOME EXERCISE PROGRAM: Resonant voice therapy ex's, breathing ex's, and stretches Phonation resistance training ex's    GOALS: Goals reviewed with patient? Yes  SHORT TERM GOALS: Target date: 10 sessions  The patient will demonstrate abdominal breathing patterns and steady release of breath on exhalation to optimize efficiency of voicing and decrease laryngeal hyperfunction.   Baseline: Goal status: MET  2.  Patient will ID x3 strategies to improve vocal quality, vocal hygiene, voice projection, and prevent vocal fatigue.    Baseline:  Goal status: MET  3.  The patient will utilize a forward tone focused/resonant voice to decrease vocal hyperfunction and improve voice quality and vocal projection.  Baseline:  Goal status: MET  4.  The patient will participate in 5-8 minutes conversation, maintaining average loudness of 75 dB and loud, good quality voice with min cues.      Baseline:  Goal status: IN PROGRESS  5.  The patient will decrease laryngeal and articulatory muscle tension by independently completing relaxation/stretching exercises with min cueing.   Baseline:  Goal status: MET   LONG TERM GOALS: Target date: 12 weeks  Patient will report improved communication effectiveness as measured by PROM  Baseline: VHI 23/120 Goal status: IN PROGRESS  2.  The  patient will participate in 15-20 minutes conversation, maintaining average loudness of 75 dB and loud, good quality voice.   Baseline:  Goal status: IN PROGRESS  3.  The patient will decrease laryngeal and articulatory muscle tension by independently completing relaxation/stretching exercises independently.  Baseline:  Goal status: MET   ASSESSMENT:  CLINICAL IMPRESSION: Patient is a 57 y.o. female who was seen today for voice evaluation in setting of L vocal cord paralysis (confirmed by ENT in September 2024 via laryngoscopy). Patient presents with dysphonia characterized by perceptual features of hoarse, vocal quality, intermittently low vocal intensity and low pitch, and pitch breaks. Pt with tendency to utilize clavicular breathing with phonation/effort. Pt is motivated to improve vocal quality. See above for details of tx session. I recommend skilled ST to improve vocal quality, endurance, and vocal hygiene to meet other vocal demands of work and home.   OBJECTIVE IMPAIRMENTS include voice disorder. These impairments are limiting patient from effectively communicating at home and in community. Factors affecting potential to achieve goals and functional outcome are medical prognosis (paralyzed vocal cord; further medical work up pending). Patient will benefit from skilled SLP services to address above impairments and improve overall function.  REHAB POTENTIAL: Good  PLAN: SLP FREQUENCY:  1-2x/week  SLP DURATION: 12 weeks  PLANNED INTERVENTIONS: Environmental controls, Cueing hierachy, Internal/external aids, Functional tasks, Multimodal communication approach, SLP instruction and feedback, Compensatory strategies, Patient/family education, and Re-evaluation    Clyde Canterbury, M.S., CCC-SLP Speech-Language Pathologist [place] [facility] [phone] Arnette Felts)  [facility] Outpatient Rehabilitation at [facility] [address] [place], [place], [address] Phone: [phone]   Fax:  [phone]

## 2023-09-06 ENCOUNTER — Ambulatory Visit: Payer: 59

## 2023-09-06 DIAGNOSIS — R490 Dysphonia: Secondary | ICD-10-CM

## 2023-09-06 NOTE — Therapy (Signed)
OUTPATIENT SPEECH LANGUAGE PATHOLOGY  VOICE TREATMENT  Patient Name: Tara [name] MRN: [id number] DOB:[birth year], 57 y.o., female Today's Date: 09/06/2023  PCP: Dr. Charlton Haws  REFERRING PROVIDER: Dr. Linus Salmons      End of Session - 09/06/23 1618     Visit Number 17    Number of Visits 24    Date for SLP Re-Evaluation 09/27/23    Progress Note Due on Visit 20    SLP Start Time 1530    SLP Stop Time  1615    SLP Time Calculation (min) 45 min    Activity Tolerance Patient tolerated treatment well             Past Medical History:  Diagnosis Date   Breast lump in female    rt side 10 oclock   Elevated BP    Endometriosis of intestine    Sigmoid colon polyp   Essential hypertension    Fibroid    h/o   Lymphadenopathy    Night sweats    Perimenopausal    Vaginal Pap smear, abnormal    ascus.pos   Wears contact lenses    Past Surgical History:  Procedure Laterality Date   ABDOMINAL HYSTERECTOMY  2008   lsh/rso   BREAST EXCISIONAL BIOPSY Right    benign   BREAST SURGERY Right    lumpectomy    COLONOSCOPY WITH PROPOFOL N/A 08/30/2015   Procedure: COLONOSCOPY WITH PROPOFOL;  Surgeon: Midge Minium, MD;  Location: [facility];  Service: Endoscopy;  Laterality: N/A;   FLEXIBLE BRONCHOSCOPY N/A 08/20/2023   Procedure: FLEXIBLE BRONCHOSCOPY;  Surgeon: Vida Rigger, MD;  Location: [facility];  Service: Thoracic;  Laterality: N/A;   OOPHORECTOMY Right    POLYPECTOMY  08/30/2015   Procedure: POLYPECTOMY;  Surgeon: Midge Minium, MD;  Location: [facility];  Service: Endoscopy;;   VIDEO BRONCHOSCOPY WITH ENDOBRONCHIAL ULTRASOUND N/A 08/20/2023   Procedure: VIDEO BRONCHOSCOPY WITH ENDOBRONCHIAL ULTRASOUND;  Surgeon: Vida Rigger, MD;  Location: [facility];  Service: Thoracic;  Laterality: N/A;   Patient Active Problem List   Diagnosis Date Noted   Fatigue 08/11/2018   BV (bacterial vaginosis) 12/22/2016   Special screening for malignant  neoplasms, colon    Benign neoplasm of ascending colon    Benign neoplasm of transverse colon    Benign neoplasm of sigmoid colon    Elevated blood pressure 07/31/2015   History of hysterectomy, supracervical 07/31/2015    ONSET DATE:  referral date 06/16/23; late June 2024 onset per pt report   REFERRING DIAG: dysphonia  THERAPY DIAG:  Dysphonia  Rationale for Evaluation and Treatment Rehabilitation  SUBJECTIVE:   SUBJECTIVE STATEMENT: Pt alert, pleasant, and cooperative. Mildly hoarse vocal quality.  Pt accompanied by: self  PERTINENT HISTORY: 57 y.o. female referred by Dr. Jenne Campus for chief complaint of a sudden onset of mild, intermittent hoarseness which started in June 2024.   DIAGNOSTIC FINDINGS: Laryngoscopy on 06/15/23 noted, paramedian L vocal cord paralysis with "good compensation from the R cord." CT chest, 07/22/23, "1. Numerous enlarged mediastinal lymph nodes. 2. Multiple small bilateral pulmonary nodules. 3. Bandlike scarring and volume loss of the right middle lobe. 4. Findings are concerning for malignancy or metastatic disease, however could reflect benign infectious or inflammatory etiology such as granulomatous infection or sarcoidosis. PET-CT may be helpful to assess for abnormal metabolic activity. Consider tissue sampling. 5. Cardiomegaly." CT soft tissue neck, 07/22/23, "1. Findings compatible with left vocal cord paralysis. No dominant neck mass. 2. Mildly enlarged left  supraclavicular lymph nodes, indeterminate. Please see the separate chest CT report for thoracic findings and additional recommendations." Head CT, 07/22/23, "negative head CT."  PAIN:  Are you having pain? No   FALLS: Has patient fallen in last 6 months? No, Number of falls: 0  LIVING ENVIRONMENT: Lives with: lives alone   PLOF: Independent  PATIENT GOALS    to improve voice  OBJECTIVE:  Today's Treatment: Pt had f/u with Pulmonology on 12/6. Pt diagnosed with "mild  pulmonary sarcoidosis." Per pt, pt considering steroids to improve s/sx. Pt provided with lit review of vocal cord paralysis in setting of sarcoidosis.  Reviewed different treatment methods including PHORTE, Resonant Voice, clear speech, and SOVT. Pt endorsed completing PHORTE ex's at home. Pt to continue HEP.    PATIENT EDUCATION: Education details: as above Person educated: Patient Education method: Explanation Education comprehension: verbalized understanding; needs reinforcement   HOME EXERCISE PROGRAM: Resonant voice therapy ex's, breathing ex's, and stretches Phonation resistance training ex's    GOALS: Goals reviewed with patient? Yes  SHORT TERM GOALS: Target date: 10 sessions  The patient will demonstrate abdominal breathing patterns and steady release of breath on exhalation to optimize efficiency of voicing and decrease laryngeal hyperfunction.   Baseline: Goal status: MET  2.  Patient will ID x3 strategies to improve vocal quality, vocal hygiene, voice projection, and prevent vocal fatigue.    Baseline:  Goal status: MET  3.  The patient will utilize a forward tone focused/resonant voice to decrease vocal hyperfunction and improve voice quality and vocal projection.  Baseline:  Goal status: MET  4.  The patient will participate in 5-8 minutes conversation, maintaining average loudness of 75 dB and loud, good quality voice with min cues.      Baseline:  Goal status: IN PROGRESS  5.  The patient will decrease laryngeal and articulatory muscle tension by independently completing relaxation/stretching exercises with min cueing.   Baseline:  Goal status: MET   LONG TERM GOALS: Target date: 12 weeks  Patient will report improved communication effectiveness as measured by PROM  Baseline: VHI 23/120 Goal status: IN PROGRESS  2.  The patient will participate in 15-20 minutes conversation, maintaining average loudness of 75 dB and loud, good quality voice.    Baseline:  Goal status: IN PROGRESS  3.  The patient will decrease laryngeal and articulatory muscle tension by independently completing relaxation/stretching exercises independently.  Baseline:  Goal status: MET   ASSESSMENT:  CLINICAL IMPRESSION: Patient is a 57 y.o. female who was seen today for voice evaluation in setting of L vocal cord paralysis (confirmed by ENT in September 2024 via laryngoscopy). Patient presents with dysphonia characterized by perceptual features of hoarse, vocal quality, intermittently low vocal intensity and low pitch, and pitch breaks. Pt with tendency to utilize clavicular breathing with phonation/effort. Pt is motivated to improve vocal quality. See above for details of tx session. I recommend skilled ST to improve vocal quality, endurance, and vocal hygiene to meet other vocal demands of work and home.   OBJECTIVE IMPAIRMENTS include voice disorder. These impairments are limiting patient from effectively communicating at home and in community. Factors affecting potential to achieve goals and functional outcome are medical prognosis (paralyzed vocal cord; further medical work up pending). Patient will benefit from skilled SLP services to address above impairments and improve overall function.  REHAB POTENTIAL: Good  PLAN: SLP FREQUENCY: 1-2x/week  SLP DURATION: 12 weeks  PLANNED INTERVENTIONS: Environmental controls, Cueing hierachy, Internal/external aids, Functional tasks, Multimodal communication  approach, SLP instruction and feedback, Compensatory strategies, Patient/family education, and Re-evaluation    Clyde Canterbury, M.S., CCC-SLP Speech-Language Pathologist [place] [facility] [phone] Arnette Felts)  [place] [facility] Outpatient Rehabilitation at [facility] [address] [place], [place], [address] Phone: [phone]   Fax:  [phone]

## 2023-09-07 ENCOUNTER — Other Ambulatory Visit: Payer: Self-pay | Admitting: Obstetrics and Gynecology

## 2023-09-07 DIAGNOSIS — Z1231 Encounter for screening mammogram for malignant neoplasm of breast: Secondary | ICD-10-CM

## 2023-09-09 ENCOUNTER — Ambulatory Visit: Payer: 59

## 2023-09-09 ENCOUNTER — Ambulatory Visit: Payer: 59 | Admitting: Obstetrics and Gynecology

## 2023-09-09 ENCOUNTER — Encounter: Payer: Self-pay | Admitting: Obstetrics and Gynecology

## 2023-09-09 VITALS — BP 119/81 | HR 69 | Ht 67.0 in | Wt 188.6 lb

## 2023-09-09 DIAGNOSIS — Z1231 Encounter for screening mammogram for malignant neoplasm of breast: Secondary | ICD-10-CM

## 2023-09-09 DIAGNOSIS — Z79890 Hormone replacement therapy (postmenopausal): Secondary | ICD-10-CM

## 2023-09-09 DIAGNOSIS — Z01419 Encounter for gynecological examination (general) (routine) without abnormal findings: Secondary | ICD-10-CM

## 2023-09-09 DIAGNOSIS — N951 Menopausal and female climacteric states: Secondary | ICD-10-CM

## 2023-09-09 MED ORDER — ESTRADIOL 1 MG PO TABS
1.0000 mg | ORAL_TABLET | Freq: Every day | ORAL | 3 refills | Status: DC
Start: 2023-09-09 — End: 2023-11-08

## 2023-09-09 NOTE — Progress Notes (Signed)
Patients presents for annual exam today. She states doing well with current HRT. Due for mammogram, scheduled for 10/05/23. Patients annual labs are up to date. Up to date on pap smear, due next year. She states no other questions or concerns at this time.

## 2023-09-09 NOTE — Progress Notes (Signed)
HPI:      [name] is a 57 y.o. G1P1001 who LMP was No LMP recorded. Patient has had a hysterectomy.  Subjective:   She presents today for her annual examination.  She is taking estrogen and says it is preventing her hot flashes.  She would like to continue.  She has a previous hysterectomy.  She has scheduled her mammogram for January. Of significant note, she has recently been diagnosed with sarcoidosis.  This is paralyzed one of her vocal cords.  She plans to go on steroids in January to try to regain the function of her vocal cords.  She also has a follow-up test for respiratory function scheduled.    Hx: The following portions of the patient's history were reviewed and updated as appropriate:             She  has a past medical history of Breast lump in female, Elevated BP, Endometriosis of intestine, Essential hypertension, Fibroid, Lymphadenopathy, Night sweats, Perimenopausal, Vaginal Pap smear, abnormal, and Wears contact lenses. She does not have any pertinent problems on file. She  has a past surgical history that includes Breast surgery (Right); Abdominal hysterectomy (2008); Colonoscopy with propofol (N/A, 08/30/2015); polypectomy (08/30/2015); Oophorectomy (Right); Breast excisional biopsy (Right); Video bronchoscopy with endobronchial ultrasound (N/A, 08/20/2023); and Flexible bronchoscopy (N/A, 08/20/2023). Her family history includes Breast cancer in her sister. She  reports that she has never smoked. She has never used smokeless tobacco. She reports current alcohol use. She reports that she does not use drugs. She has a current medication list which includes the following prescription(s): aspirin ec, bacillus coagulans-inulin, cholecalciferol, collagen, hydrochlorothiazide, mometasone, multivitamin, and estradiol. She is allergic to sulfa antibiotics.       Review of Systems:  Review of Systems  Constitutional: Denied constitutional symptoms, night sweats, recent  illness, fatigue, fever, insomnia and weight loss.  Eyes: Denied eye symptoms, eye pain, photophobia, vision change and visual disturbance.  Ears/Nose/Throat/Neck: See HPI for additional information.  Cardiovascular: Denied cardiovascular symptoms, arrhythmia, chest pain/pressure, edema, exercise intolerance, orthopnea and palpitations.  Respiratory: Denied pulmonary symptoms, asthma, pleuritic pain, productive sputum, cough, dyspnea and wheezing.  Gastrointestinal: Denied, gastro-esophageal reflux, melena, nausea and vomiting.  Genitourinary: Denied genitourinary symptoms including symptomatic vaginal discharge, pelvic relaxation issues, and urinary complaints.  Musculoskeletal: Denied musculoskeletal symptoms, stiffness, swelling, muscle weakness and myalgia.  Dermatologic: Denied dermatology symptoms, rash and scar.  Neurologic: Denied neurology symptoms, dizziness, headache, neck pain and syncope.  Psychiatric: Denied psychiatric symptoms, anxiety and depression.  Endocrine: Denied endocrine symptoms including hot flashes and night sweats.   Meds:   Current Outpatient Medications on File Prior to Visit  Medication Sig Dispense Refill   aspirin EC 81 MG tablet Take 81 mg by mouth daily. Swallow whole.     Bacillus Coagulans-Inulin (ALIGN PREBIOTIC-PROBIOTIC PO) Take 1 capsule by mouth daily.     cholecalciferol (VITAMIN D) 1000 UNITS tablet Take 1,000 Units by mouth daily.     Collagen 500 MG CAPS Take 1 capsule by mouth daily.     hydrochlorothiazide (HYDRODIURIL) 12.5 MG tablet Take 2 tablets (25 mg total) by mouth daily. 60 tablet 1   mometasone (ELOCON) 0.1 % cream Apply 1 Application topically daily as needed (Rash). Use at neck , 5 x weekly 45 g 2   Multiple Vitamin (MULTIVITAMIN) tablet Take 1 tablet by mouth daily.     No current facility-administered medications on file prior to visit.      Objective:  Vitals:   09/09/23 0817  BP: 119/81  Pulse: 69   Filed Weights    09/09/23 0817  Weight: 188 lb 9.6 oz (85.5 kg)              Patient has deferred examination today.          Assessment:    G1P1001 Patient Active Problem List   Diagnosis Date Noted   Fatigue 08/11/2018   BV (bacterial vaginosis) 12/22/2016   Special screening for malignant neoplasms, colon    Benign neoplasm of ascending colon    Benign neoplasm of transverse colon    Benign neoplasm of sigmoid colon    Elevated blood pressure 07/31/2015   History of hysterectomy, supracervical 07/31/2015     1. Well woman exam with routine gynecological exam   2. Hormone replacement therapy (HRT)   3. Symptomatic menopausal or female climacteric states     She is caught up with her annual examination labs and screening tests.  She likes being on ERT.  She would like to continue   Plan:            1.  Basic Screening Recommendations The basic screening recommendations for asymptomatic women were discussed with the patient during her visit.  The age-appropriate recommendations were discussed with her and the rational for the tests reviewed.  When I am informed by the patient that another primary care physician has previously obtained the age-appropriate tests and they are up-to-date, only outstanding tests are ordered and referrals given as necessary.  Abnormal results of tests will be discussed with her when all of her results are completed.  Routine preventative health maintenance measures emphasized: Exercise/Diet/Weight control, Tobacco Warnings, Alcohol/Substance use risks and Stress Management 2.  Continue ERT Orders No orders of the defined types were placed in this encounter.    Meds ordered this encounter  Medications   estradiol (ESTRACE) 1 MG tablet    Sig: Take 1 tablet (1 mg total) by mouth daily.    Dispense:  90 tablet    Refill:  3    ZERO refills remain on this prescription. Your patient is requesting advance approval of refills for this medication to PREVENT ANY  MISSED DOSES      F/U  Return in about 1 year (around 09/08/2024) for Annual Physical.  Elonda Husky, M.D. 09/09/2023 9:28 AM

## 2023-09-13 ENCOUNTER — Telehealth: Payer: Self-pay

## 2023-09-13 ENCOUNTER — Ambulatory Visit: Payer: 59

## 2023-09-13 NOTE — Telephone Encounter (Signed)
Spoke with pt about missed appointment scheduled for today @ 1445. Pt thought it was at 1530. Pt stated she will plan to be at her next scheduled appointment on Monday, 12/23 @ 1530.   Clyde Canterbury, M.S., CCC-SLP Speech-Language Pathologist [facility] [phone] ([facility])

## 2023-09-16 ENCOUNTER — Ambulatory Visit: Payer: 59

## 2023-09-16 DIAGNOSIS — R490 Dysphonia: Secondary | ICD-10-CM | POA: Diagnosis not present

## 2023-09-16 NOTE — Therapy (Signed)
OUTPATIENT SPEECH LANGUAGE PATHOLOGY  VOICE TREATMENT / DISCHARGE SUMMARY  Patient Name: Tara [name] MRN: [id number] DOB:1965/10/30, 57 y.o., female Today's Date: 09/16/2023  PCP: Dr. Charlton Haws  REFERRING PROVIDER: Dr. Linus Salmons      End of Session - 09/16/23 1435     Visit Number 18    Number of Visits 24    Date for SLP Re-Evaluation 09/27/23    Progress Note Due on Visit 20    SLP Start Time 1400    SLP Stop Time  1438    SLP Time Calculation (min) 38 min    Activity Tolerance Patient tolerated treatment well             Past Medical History:  Diagnosis Date   Breast lump in female    rt side 10 oclock   Elevated BP    Endometriosis of intestine    Sigmoid colon polyp   Essential hypertension    Fibroid    h/o   Lymphadenopathy    Night sweats    Perimenopausal    Vaginal Pap smear, abnormal    ascus.pos   Wears contact lenses    Past Surgical History:  Procedure Laterality Date   ABDOMINAL HYSTERECTOMY  2008   lsh/rso   BREAST EXCISIONAL BIOPSY Right    benign   BREAST SURGERY Right    lumpectomy    COLONOSCOPY WITH PROPOFOL N/A 08/30/2015   Procedure: COLONOSCOPY WITH PROPOFOL;  Surgeon: Midge Minium, MD;  Location: [facility];  Service: Endoscopy;  Laterality: N/A;   FLEXIBLE BRONCHOSCOPY N/A 08/20/2023   Procedure: FLEXIBLE BRONCHOSCOPY;  Surgeon: Vida Rigger, MD;  Location: [facility];  Service: Thoracic;  Laterality: N/A;   OOPHORECTOMY Right    POLYPECTOMY  08/30/2015   Procedure: POLYPECTOMY;  Surgeon: Midge Minium, MD;  Location: [facility];  Service: Endoscopy;;   VIDEO BRONCHOSCOPY WITH ENDOBRONCHIAL ULTRASOUND N/A 08/20/2023   Procedure: VIDEO BRONCHOSCOPY WITH ENDOBRONCHIAL ULTRASOUND;  Surgeon: Vida Rigger, MD;  Location: [facility];  Service: Thoracic;  Laterality: N/A;   Patient Active Problem List   Diagnosis Date Noted   Fatigue 08/11/2018   BV (bacterial vaginosis) 12/22/2016   Special  screening for malignant neoplasms, colon    Benign neoplasm of ascending colon    Benign neoplasm of transverse colon    Benign neoplasm of sigmoid colon    Elevated blood pressure 07/31/2015   History of hysterectomy, supracervical 07/31/2015    ONSET DATE:  referral date 06/16/23; late June 2024 onset per pt report   REFERRING DIAG: dysphonia  THERAPY DIAG:  Dysphonia  Rationale for Evaluation and Treatment Rehabilitation  SUBJECTIVE:   SUBJECTIVE STATEMENT: Pt alert, pleasant, and cooperative. Mildly hoarse vocal quality.  Pt accompanied by: self  PERTINENT HISTORY: 57 y.o. female referred by Dr. Jenne Campus for chief complaint of a sudden onset of mild, intermittent hoarseness which started in June 2024.   DIAGNOSTIC FINDINGS: Laryngoscopy on 06/15/23 noted, paramedian L vocal cord paralysis with "good compensation from the R cord." CT chest, 07/22/23, "1. Numerous enlarged mediastinal lymph nodes. 2. Multiple small bilateral pulmonary nodules. 3. Bandlike scarring and volume loss of the right middle lobe. 4. Findings are concerning for malignancy or metastatic disease, however could reflect benign infectious or inflammatory etiology such as granulomatous infection or sarcoidosis. PET-CT may be helpful to assess for abnormal metabolic activity. Consider tissue sampling. 5. Cardiomegaly." CT soft tissue neck, 07/22/23, "1. Findings compatible with left vocal cord paralysis. No dominant neck mass. 2.  Mildly enlarged left supraclavicular lymph nodes, indeterminate. Please see the separate chest CT report for thoracic findings and additional recommendations." Head CT, 07/22/23, "negative head CT."  PAIN:  Are you having pain? No   FALLS: Has patient fallen in last 6 months? No, Number of falls: 0  LIVING ENVIRONMENT: Lives with: lives alone   PLOF: Independent  PATIENT GOALS    to improve voice  OBJECTIVE:  Today's Treatment: Repeated PROM with results as  follows:  VOICE HANDICAP INDEX (VHI)  The Voice Handicap Index is comprised of a series of questions to assess the patient's perception of their voice. It is designed to evaluate the emotional, physical and functional components of the voice problem.  Functional: 0/40 Physical: 10/40 Emotional: 0/40  Total: 10/120 - previously 24/120   Reviewed different treatment methods including PHORTE, Resonant Voice, clear speech, and SOVT. Pt endorsed completing PHORTE ex's at home. Pt to continue HEP.  Pt able to maintain adequate loudness with a good vocal quality the majority of the time for ~20 minutes.     PATIENT EDUCATION: Education details: as above Person educated: Patient Education method: Explanation Education comprehension: verbalized understanding;  HOME EXERCISE PROGRAM: Resonant voice therapy ex's, breathing ex's, and stretches Phonation resistance training ex's    GOALS: Goals reviewed with patient? Yes  SHORT TERM GOALS: Target date: 10 sessions  The patient will demonstrate abdominal breathing patterns and steady release of breath on exhalation to optimize efficiency of voicing and decrease laryngeal hyperfunction.   Baseline: Goal status: MET  2.  Patient will ID x3 strategies to improve vocal quality, vocal hygiene, voice projection, and prevent vocal fatigue.    Baseline:  Goal status: MET  3.  The patient will utilize a forward tone focused/resonant voice to decrease vocal hyperfunction and improve voice quality and vocal projection.  Baseline:  Goal status: MET  4.  The patient will participate in 5-8 minutes conversation, maintaining average loudness of 75 dB and loud, good quality voice with min cues.      Baseline:  Goal status: MET  5.  The patient will decrease laryngeal and articulatory muscle tension by independently completing relaxation/stretching exercises with min cueing.   Baseline:  Goal status: MET   LONG TERM GOALS: Target date: 12  weeks  Patient will report improved communication effectiveness as measured by PROM  Baseline: VHI 23/120 Goal status: MET  2.  The patient will participate in 15-20 minutes conversation, maintaining average loudness of 75 dB and loud, good quality voice.   Baseline:  Goal status: MET  3.  The patient will decrease laryngeal and articulatory muscle tension by independently completing relaxation/stretching exercises independently.  Baseline:  Goal status: MET   ASSESSMENT:  CLINICAL IMPRESSION: Patient is a 57 y.o. female who was seen today for voice evaluation in setting of L vocal cord paralysis (confirmed by ENT in September 2024 via laryngoscopy). Mild dysphonia perisists, but pt indep with HEP. See details of tx session. Pt has met all goals and is now d/c'd from SLP services. Pt with plan to f/u with Pulmonology (considering course of steroids for sarcoidosis) and ENT.   OBJECTIVE IMPAIRMENTS include voice disorder. These impairments are limiting patient from effectively communicating at home and in community. Factors affecting potential to achieve goals and functional outcome are medical prognosis (paralyzed vocal cord; further medical work up pending). Patient will benefit from skilled SLP services to address above impairments and improve overall function.  REHAB POTENTIAL: Good  PLAN: D/C from SLP  services    Clyde Canterbury, M.S., CCC-SLP Speech-Language Pathologist [facility] [phone] Arnette Felts)  [facility] at [facility] [address] [place], [place], [address] Phone: [phone]   Fax:  [phone]

## 2023-09-20 ENCOUNTER — Ambulatory Visit: Payer: 59

## 2023-09-23 ENCOUNTER — Ambulatory Visit: Payer: 59

## 2023-09-27 ENCOUNTER — Ambulatory Visit: Payer: 59

## 2023-09-30 ENCOUNTER — Ambulatory Visit: Payer: 59

## 2023-10-05 ENCOUNTER — Ambulatory Visit
Admission: RE | Admit: 2023-10-05 | Discharge: 2023-10-05 | Disposition: A | Discharge status: Home / Self Care | Payer: 59 | Source: Ambulatory Visit | Attending: Obstetrics and Gynecology | Admitting: Obstetrics and Gynecology

## 2023-10-05 DIAGNOSIS — Z1231 Encounter for screening mammogram for malignant neoplasm of breast: Secondary | ICD-10-CM | POA: Diagnosis present

## 2023-11-08 ENCOUNTER — Other Ambulatory Visit: Payer: Self-pay | Admitting: Obstetrics and Gynecology

## 2023-11-08 DIAGNOSIS — N951 Menopausal and female climacteric states: Secondary | ICD-10-CM

## 2023-11-08 DIAGNOSIS — Z79890 Hormone replacement therapy (postmenopausal): Secondary | ICD-10-CM

## 2024-01-13 ENCOUNTER — Other Ambulatory Visit: Payer: Self-pay

## 2024-01-13 ENCOUNTER — Emergency Department

## 2024-01-13 ENCOUNTER — Emergency Department
Admission: EM | Admit: 2024-01-13 | Discharge: 2024-01-13 | Disposition: A | Discharge status: Home / Self Care | Attending: Emergency Medicine | Admitting: Emergency Medicine

## 2024-01-13 DIAGNOSIS — I10 Essential (primary) hypertension: Secondary | ICD-10-CM | POA: Diagnosis not present

## 2024-01-13 DIAGNOSIS — Z79899 Other long term (current) drug therapy: Secondary | ICD-10-CM | POA: Insufficient documentation

## 2024-01-13 DIAGNOSIS — R42 Dizziness and giddiness: Secondary | ICD-10-CM | POA: Diagnosis present

## 2024-01-13 LAB — CBC
HCT: 47.2 % — ABNORMAL HIGH (ref 36.0–46.0)
Hemoglobin: 15.3 g/dL — ABNORMAL HIGH (ref 12.0–15.0)
MCH: 26.6 pg (ref 26.0–34.0)
MCHC: 32.4 g/dL (ref 30.0–36.0)
MCV: 82.1 fL (ref 80.0–100.0)
Platelets: 243 10*3/uL (ref 150–400)
RBC: 5.75 MIL/uL — ABNORMAL HIGH (ref 3.87–5.11)
RDW: 14.5 % (ref 11.5–15.5)
WBC: 6.3 10*3/uL (ref 4.0–10.5)
nRBC: 0 % (ref 0.0–0.2)

## 2024-01-13 LAB — TSH: TSH: 1.689 u[IU]/mL (ref 0.350–4.500)

## 2024-01-13 LAB — TROPONIN I (HIGH SENSITIVITY)
Troponin I (High Sensitivity): 3 ng/L (ref <18)
Troponin I (High Sensitivity): 3 ng/L (ref <18)

## 2024-01-13 LAB — BASIC METABOLIC PANEL WITH GFR
Anion gap: 10 (ref 5–15)
BUN: 17 mg/dL (ref 6–20)
CO2: 23 mmol/L (ref 22–32)
Calcium: 9.3 mg/dL (ref 8.9–10.3)
Chloride: 104 mmol/L (ref 98–111)
Creatinine, Ser: 0.95 mg/dL (ref 0.44–1.00)
GFR, Estimated: >60 mL/min (ref >60)
Glucose, Bld: 99 mg/dL (ref 70–99)
Potassium: 3.6 mmol/L (ref 3.5–5.1)
Sodium: 137 mmol/L (ref 135–145)

## 2024-01-13 LAB — T4, FREE: Free T4: 0.85 ng/dL (ref 0.61–1.12)

## 2024-01-13 NOTE — Discharge Instructions (Addendum)
 Take your blood pressure once daily while resting and record these numbers to review with your doctor to adjust your blood pressure medications as needed.  Fortunately your testing in the Emergency Department did not reveal any emergency conditions that accounted for your transient and resolved symptoms from this morning.  Thank you for choosing us  for your health care today!  Please see your primary doctor this week for a follow up appointment.   If you have any new, worsening, or unexpected symptoms call your doctor right away or come back to the emergency department for reevaluation.  It was my pleasure to care for you today.   Arron Large Margery Sheets, MD

## 2024-01-13 NOTE — ED Notes (Signed)
 Pt was able to ambulate to the bathroom with no assistance.  Pt denied feeling dizzy or lightheaded.

## 2024-01-13 NOTE — ED Triage Notes (Signed)
 Pt to ED with complaint of feeling "light headed" at around 0400 that has since resolved. Pt denies numbness/weakness in arms or legs. Speech clear, no visual changes.    Hx HTN, sarcoidosis

## 2024-01-13 NOTE — ED Provider Notes (Signed)
 Asked to follow-up on troponin and thyroid labs which are reassuring, patient is appropriate for discharge   Tara Carbine, MD 01/13/24 [phone]

## 2024-01-13 NOTE — ED Provider Notes (Signed)
 [facility] Provider Note    None    (approximate)   History   Dizziness   HPI  Tara [name] is a 58 y.o. female   Past medical history of sarcoidosis on prednisone, hypertension on amlodipine, who presents to the emergency department with a transient episode of lightheadedness and a sense of anxiety and vaguely "not feeling right" this morning.  She was in her bed and awoke around 4 AM to use the bathroom to urinate.  During that time she felt a sense of lightheadedness as she walked to the bathroom, a vague sense of generalized weakness and overwhelming fatigue, anxiety  but denies frank chest pain palpitations shortness of breath headache or vision changes or any focal weakness.  She was able to urinate and got back to her bed to rest after which the symptoms subsided.  Currently she feels back to her complete baseline.  On review of systems she denies any respiratory infectious symptoms, GI or GU complaints, recent changes in medications (except that she started on prednisone at the end of February.) Her p.o. intake has been normal and she has had no GI illnesses recently.  She denies drug or alcohol use.  She has had no leg swelling or pain.  She does note that her work has been extra stressful recently as she works for an Production designer, theatre/television/film for [employer] and they have had busy projects.  Independent Historian contributed to assessment above: Her brother at the bedside to corroborate information past medical history as above     Physical Exam   Triage Vital Signs: ED Triage Vitals  Encounter Vitals Group     BP --      Systolic BP Percentile --      Diastolic BP Percentile --      Pulse Rate 01/13/24 0556 84     Resp 01/13/24 0556 18     Temp 01/13/24 0556 98.4 F (36.9 C)     Temp Source 01/13/24 0556 Oral     SpO2 01/13/24 0556 100 %     Weight 01/13/24 0550 180 lb (81.6 kg)     Height 01/13/24 0550 5\' 7"  (1.702 m)     Head Circumference --       Peak Flow --      Pain Score 01/13/24 0549 0     Pain Loc --      Pain Education --      Exclude from Growth Chart --     Most recent vital signs: Vitals:   01/13/24 0600 01/13/24 0630  BP: (!) 180/94 (!) 157/75  Pulse: 69 66  Resp: 13 12  Temp:    SpO2: 100% 100%    General: Awake, no distress.  CV:  Good peripheral perfusion.  Resp:  Normal effort.  Abd:  No distention.  Other:  Hypertensive otherwise vital signs are normal.  She has no focal neurologic deficits including testing of the facial asymmetry, dysarthria, motor or sensory exam of all 4 extremities, gait.  She has no nystagmus.  She appears euvolemic and comfortable.  Her lungs are clear.  Her abdomen is soft and nontender.   ED Results / Procedures / Treatments   Labs (all labs ordered are listed, but only abnormal results are displayed) Labs Reviewed  CBC - Abnormal; Notable for the following components:      Result Value   RBC 5.75 (*)    Hemoglobin 15.3 (*)    HCT 47.2 (*)  All other components within normal limits  BASIC METABOLIC PANEL WITH GFR  URINALYSIS, ROUTINE W REFLEX MICROSCOPIC  T4, FREE  TSH  CBG MONITORING, ED  TROPONIN I (HIGH SENSITIVITY)  TROPONIN I (HIGH SENSITIVITY)     I ordered and reviewed the above labs they are notable for cell counts unremarkable  EKG  ED ECG REPORT I, Pilar Jarvis, the attending physician, personally viewed and interpreted this ECG.   Date: 01/13/2024  EKG Time: 0554  Rate: 65  Rhythm: sinus  Axis: nl  Intervals:none  ST&T Change: no stemi    RADIOLOGY I independently reviewed and interpreted ct head and see no obvious bleed or midline shift I also reviewed radiologist's formal read.   PROCEDURES:  Critical Care performed: No  Procedures   MEDICATIONS ORDERED IN ED: Medications - No data to display   IMPRESSION / MDM / ASSESSMENT AND PLAN / ED COURSE  I reviewed the triage vital signs and the nursing notes.                                 Patient's presentation is most consistent with acute complicated illness / injury requiring diagnostic workup.  Differential diagnosis includes, but is not limited to, orthostatic lightheadedness, dehydration or electrolyte derangement, dysrhythmia, ACS, considered but less likely stroke   The patient is on the cardiac monitor to evaluate for evidence of arrhythmia and/or significant heart rate changes.  MDM:    This a patient with transient lightheadedness this morning that has now completely resolved.  She reported no focal neurologic deficits during the time and certainly has no focal neurologic deficits on my exam today, I doubt stroke or TIA.  Perhaps positional and that she got up to use the bathroom and felt the lightheadedness, although she did feel some sense of the lightheadedness while in bed as well.  She notes that the feeling was associated with a sense of anxiety and she has been stressed at work, could play a component.  I think less likely cardiopulmonary emergencies given that she had no chest pain palpitations or shortness of breath at the time but I think it prudent to check basic labs including an EKG, troponin, thyroid studies and a CT scan of her head. With her sarcoid she is higher CAD risk and despite atypical symptoms think it prudent to check serial trop given recency of sx onset.    Her blood pressure is high and she has had an adjustment upwards of her amlodipine recently to better control her blood pressure.  Her doctor had noted that her blood pressure might run high in the setting of recently starting her prednisone a couple months ago and she has close follow-up to manage her blood pressure should her medications need adjustment in the future.  I am hesitant to start her on a second agent at this time given her blood pressure may be elevated due to a number of reasons in the emergency department setting so I advised her to take her blood pressure once daily  and keep a journal log to review with her doctor as outpatient follow-up to determine blood pressure med adjustments as needed.  Ultimately since this patient is asymptomatic and stable, I think she can be discharged along with the workup as above is negative and she remains asymptomatic.        FINAL CLINICAL IMPRESSION(S) / ED DIAGNOSES   Final diagnoses:  Lightheadedness  Uncontrolled hypertension     Rx / DC Orders   ED Discharge Orders     None        Note:  This document was prepared using Dragon voice recognition software and may include unintentional dictation errors.    Buell Carmin, MD 01/13/24 [phone]

## 2024-03-18 ENCOUNTER — Other Ambulatory Visit (HOSPITAL_COMMUNITY): Payer: Self-pay

## 2024-09-08 ENCOUNTER — Other Ambulatory Visit: Payer: Self-pay | Admitting: Internal Medicine

## 2024-09-08 DIAGNOSIS — Z1231 Encounter for screening mammogram for malignant neoplasm of breast: Secondary | ICD-10-CM

## 2024-10-10 ENCOUNTER — Ambulatory Visit: Admitting: Advanced Practice Midwife

## 2024-10-11 ENCOUNTER — Ambulatory Visit
Admission: RE | Admit: 2024-10-11 | Discharge: 2024-10-11 | Disposition: A | Discharge status: Home / Self Care | Source: Ambulatory Visit | Attending: Internal Medicine | Admitting: Internal Medicine

## 2024-10-11 DIAGNOSIS — Z1231 Encounter for screening mammogram for malignant neoplasm of breast: Secondary | ICD-10-CM | POA: Diagnosis present
